# Patient Record
Sex: Female | Born: 1970 | Race: White | Hispanic: No | Marital: Married | Smoking: Never smoker
Health system: Southern US, Community
[De-identification: ages and names within clinical notes are randomized; demographics above are authoritative.]

## PROBLEM LIST (undated history)

## (undated) DIAGNOSIS — R011 Cardiac murmur, unspecified: Secondary | ICD-10-CM

## (undated) DIAGNOSIS — G4700 Insomnia, unspecified: Secondary | ICD-10-CM

## (undated) DIAGNOSIS — D649 Anemia, unspecified: Secondary | ICD-10-CM

## (undated) DIAGNOSIS — A64 Unspecified sexually transmitted disease: Secondary | ICD-10-CM

## (undated) DIAGNOSIS — E785 Hyperlipidemia, unspecified: Secondary | ICD-10-CM

## (undated) HISTORY — DX: Insomnia, unspecified: G47.00

## (undated) HISTORY — DX: Unspecified sexually transmitted disease: A64

## (undated) HISTORY — DX: Hyperlipidemia, unspecified: E78.5

## (undated) HISTORY — DX: Cardiac murmur, unspecified: R01.1

## (undated) HISTORY — DX: Anemia, unspecified: D64.9

---

## 1996-02-20 HISTORY — PX: DILATION AND EVACUATION: SHX1459

## 2004-01-17 ENCOUNTER — Other Ambulatory Visit: Admission: RE | Admit: 2004-01-17 | Discharge: 2004-01-17 | Payer: Self-pay | Admitting: Gynecology

## 2004-02-20 HISTORY — PX: UMBILICAL HERNIA REPAIR: SHX196

## 2004-05-15 ENCOUNTER — Ambulatory Visit (HOSPITAL_BASED_OUTPATIENT_CLINIC_OR_DEPARTMENT_OTHER): Admission: RE | Admit: 2004-05-15 | Discharge: 2004-05-15 | Payer: Self-pay

## 2004-05-15 ENCOUNTER — Ambulatory Visit (HOSPITAL_COMMUNITY): Admission: RE | Admit: 2004-05-15 | Discharge: 2004-05-15 | Payer: Self-pay

## 2005-04-02 ENCOUNTER — Other Ambulatory Visit: Admission: RE | Admit: 2005-04-02 | Discharge: 2005-04-02 | Payer: Self-pay | Admitting: Gynecology

## 2006-04-08 ENCOUNTER — Other Ambulatory Visit: Admission: RE | Admit: 2006-04-08 | Discharge: 2006-04-08 | Payer: Self-pay | Admitting: Gynecology

## 2007-07-08 ENCOUNTER — Other Ambulatory Visit: Admission: RE | Admit: 2007-07-08 | Discharge: 2007-07-08 | Payer: Self-pay | Admitting: Gynecology

## 2009-04-28 ENCOUNTER — Encounter: Admission: RE | Admit: 2009-04-28 | Discharge: 2009-04-28 | Payer: Self-pay | Admitting: Obstetrics & Gynecology

## 2009-05-10 ENCOUNTER — Encounter: Admission: RE | Admit: 2009-05-10 | Discharge: 2009-05-10 | Payer: Self-pay | Admitting: Obstetrics & Gynecology

## 2009-11-29 ENCOUNTER — Encounter: Admission: RE | Admit: 2009-11-29 | Discharge: 2009-11-29 | Payer: Self-pay | Admitting: Obstetrics & Gynecology

## 2010-06-06 ENCOUNTER — Other Ambulatory Visit: Payer: Self-pay | Admitting: Obstetrics & Gynecology

## 2010-06-06 DIAGNOSIS — R921 Mammographic calcification found on diagnostic imaging of breast: Secondary | ICD-10-CM

## 2010-06-09 ENCOUNTER — Ambulatory Visit
Admission: RE | Admit: 2010-06-09 | Discharge: 2010-06-09 | Disposition: A | Discharge status: Home / Self Care | Payer: Commercial Indemnity | Source: Ambulatory Visit | Attending: Obstetrics & Gynecology | Admitting: Obstetrics & Gynecology

## 2010-06-09 DIAGNOSIS — R921 Mammographic calcification found on diagnostic imaging of breast: Secondary | ICD-10-CM

## 2011-03-21 ENCOUNTER — Other Ambulatory Visit: Payer: Self-pay | Admitting: Certified Nurse Midwife

## 2011-03-21 DIAGNOSIS — N63 Unspecified lump in unspecified breast: Secondary | ICD-10-CM

## 2011-03-21 LAB — HM PAP SMEAR: HM Pap smear: NEGATIVE

## 2011-03-29 ENCOUNTER — Ambulatory Visit
Admission: RE | Admit: 2011-03-29 | Discharge: 2011-03-29 | Disposition: A | Discharge status: Home / Self Care | Payer: Commercial Indemnity | Source: Ambulatory Visit | Attending: Certified Nurse Midwife | Admitting: Certified Nurse Midwife

## 2011-03-29 ENCOUNTER — Other Ambulatory Visit: Payer: Self-pay | Admitting: Obstetrics & Gynecology

## 2011-03-29 DIAGNOSIS — N63 Unspecified lump in unspecified breast: Secondary | ICD-10-CM

## 2012-03-21 ENCOUNTER — Other Ambulatory Visit: Payer: Self-pay | Admitting: Obstetrics & Gynecology

## 2012-03-21 DIAGNOSIS — Z1231 Encounter for screening mammogram for malignant neoplasm of breast: Secondary | ICD-10-CM

## 2012-04-14 ENCOUNTER — Ambulatory Visit
Admission: RE | Admit: 2012-04-14 | Discharge: 2012-04-14 | Disposition: A | Discharge status: Home / Self Care | Payer: BC Managed Care – PPO | Source: Ambulatory Visit | Attending: Obstetrics & Gynecology | Admitting: Obstetrics & Gynecology

## 2012-04-14 DIAGNOSIS — Z1231 Encounter for screening mammogram for malignant neoplasm of breast: Secondary | ICD-10-CM

## 2012-04-14 LAB — HM MAMMOGRAPHY

## 2012-07-28 ENCOUNTER — Other Ambulatory Visit: Payer: Self-pay

## 2012-09-10 ENCOUNTER — Telehealth: Payer: Self-pay | Admitting: Certified Nurse Midwife

## 2012-09-10 NOTE — Telephone Encounter (Signed)
pt has not had a cycle in three months and is having hot flashes

## 2012-09-10 NOTE — Telephone Encounter (Signed)
Patient last AEX 04/04/2012 with Ortencia Kick  In paper chart. Patient calling today with stating no cycle in 3 month for sure but can not remember if had one in April / 2014. Hot flashes are getting more also.  Appointment given for Friday July 25th @ 1:30pm for OV with Dr. Edward Jolly.

## 2012-09-12 ENCOUNTER — Ambulatory Visit (INDEPENDENT_AMBULATORY_CARE_PROVIDER_SITE_OTHER): Payer: BC Managed Care – PPO | Admitting: Obstetrics and Gynecology

## 2012-09-12 ENCOUNTER — Encounter: Payer: Self-pay | Admitting: Obstetrics and Gynecology

## 2012-09-12 VITALS — BP 100/58 | HR 68 | Ht 64.0 in | Wt 118.0 lb

## 2012-09-12 DIAGNOSIS — N926 Irregular menstruation, unspecified: Secondary | ICD-10-CM

## 2012-09-12 DIAGNOSIS — N951 Menopausal and female climacteric states: Secondary | ICD-10-CM

## 2012-09-12 DIAGNOSIS — E785 Hyperlipidemia, unspecified: Secondary | ICD-10-CM

## 2012-09-12 LAB — POCT URINE PREGNANCY: Preg Test, Ur: NEGATIVE

## 2012-09-12 NOTE — Progress Notes (Signed)
Patient ID: Crystal Macdonald, female   DOB: Jun 14, 1970, 42 y.o.   MRN: [id number]  42 y.o.   Unknown    Caucasian   female   [id number]   here for menopausal symptoms. Menses not regular for a year or two - every 17 - 33 days. Now no menses since April.   Having hot flashes, then cold.   Night sweats.  No vaginal dryness. No emotional changes.  Condoms for birth control. Not very sexually active.   Hasn't thought too much about hot flashes and treatment of them.   Uncertain about mother and age of menopause.  Took OCPs as a senior in high school due to heavy bleeding and then for birth control for 6 -7 years in her marriage. Had some nausea with them.    Strong family history of early heart disease. Patient started exercising and taking Fish Oil capsules 6 weeks ago.  03/25/12 - T chol 267, LDL - 189, HDL 68, Total chol/HDl ratio 3.9, TG 48.  Mother had low bone mass and took Fosamax.     Patient's last menstrual period was 05/20/2012.          Sexually active: yes  The current method of family planning is condoms     Last mammogram:  04/14/12 - WNL    Family History  Problem Relation Age of Onset  . Diabetes Paternal Grandmother     adult onset  . Stroke Paternal Grandmother     deceased  . Hypertension Mother   . Heart attack Mother     age 46  . Hypertension Father   . Stroke Paternal Grandfather     deceased  . Heart attack Paternal Grandfather   . Heart attack Maternal Uncle     deceased age 66  . Heart attack Maternal Grandmother   . Heart attack Maternal Aunt     There are no active problems to display for this patient.   Past Medical History  Diagnosis Date  . 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 Hyperlipidemia     Past Surgical History  Procedure Laterality Date  . Cesarean section  1996  . Dilation and evacuation  1998  . Umbilical hernia repair  2006    with mesh--Dr. Orson Slick    Allergies: Review of patient's allergies indicates  no known allergies.  Current Outpatient Prescriptions  Medication Sig Dispense Refill  . fish oil-omega-3 fatty acids 1000 MG capsule Take 2 g by mouth daily.       No current facility-administered medications for this visit.    ROS: Pertinent items are noted in HPI.  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  Exam:    BP 100/58  Pulse 68  Ht 5\' 4"  (1.626 m)  Wt 118 lb (53.524 kg)  BMI 20.24 kg/m2  LMP 05/20/2012   Wt Readings from Last 3 Encounters:  09/12/12 118 lb (53.524 kg)     Ht Readings from Last 3 Encounters:  09/12/12 5\' 4"  (1.626 m)    General appearance: alert, cooperative and appears stated age   Pelvic: External genitalia:  no lesions              Urethra:  normal appearing urethra with no masses, tenderness or lesions              Bartholins and Skenes: normal                 Vagina:  Some  petechiae of the cervix and vaginal cuff with speculum exam              Cervix: normal appearance             Bimanual Exam:  Uterus:  uterus is normal size, shape, consistency and nontender                                      Adnexa: normal adnexa in size, nontender and no masses   Assessment  Menopausal symptoms. Hyperlipidemia.   Family history of premature cardiovascular disease.  Plan  Check FSH and estradiol. Declines Provera challenge. I discussed risks and benefits of ultralow dose OCPs vs. HRT vs. Herbal remedies such as Estroven.  Will await results for decision. We discussed osteoporosis prevention. Patient will have her fasting cholesterol panel rechecked in about two months.   An After Visit Summary was printed and given to the patient.

## 2012-09-12 NOTE — Patient Instructions (Signed)
Menopause Menopause is the normal time of life when menstrual periods stop completely. Menopause is complete when you have missed 12 consecutive menstrual periods. It usually occurs between the ages of 48 to 55, with an average age of 51. Very rarely does a woman develop menopause before 44 years old. At menopause, your ovaries stop producing the female hormones, estrogen and progesterone. This can cause undesirable symptoms and also affect your health. Sometimes the symptoms may occur 4 to 5 years before the menopause begins. There is no relationship between menopause and:  Oral contraceptives.  Number of children you had.  Race.  The age your menstrual periods started (menarche). Heavy smokers and very thin women may develop menopause earlier in life. CAUSES  The ovaries stop producing the female hormones estrogen and progesterone.  Other causes include:  Surgery to remove both ovaries.  The ovaries stop functioning for no known reason.  Tumors of the pituitary gland in the brain.  Medical disease that affects the ovaries and hormone production.  Radiation treatment to the abdomen or pelvis.  Chemotherapy that affects the ovaries. SYMPTOMS   Hot flashes.  Night sweats.  Decrease in sex drive.  Vaginal dryness and thinning of the vagina causing painful intercourse.  Dryness of the skin and developing wrinkles.  Headaches.  Tiredness.  Irritability.  Memory problems.  Weight gain.  Bladder infections.  Hair growth of the face and chest.  Infertility. More serious symptoms include:  Loss of bone (osteoporosis) causing breaks (fractures).  Depression.  Hardening and narrowing of the arteries (atherosclerosis) causing heart attacks and strokes. DIAGNOSIS   When the menstrual periods have stopped for 12 straight months.  Physical exam.  Hormone studies of the blood. TREATMENT  There are many treatment choices and nearly as many questions about them.  The decisions to treat or not to treat menopausal changes is an individual choice made with your caregiver. Your caregiver can discuss the treatments with you. Together, you can decide which treatment will work best for you. Your treatment choices may include:   Hormone therapy (estorgen and progesterone).  Non-hormonal medications.  Treating the individual symptoms with medication (for example antidepressants for depression).  Herbal medications that may help specific symptoms.  Counseling by a psychiatrist or psychologist.  Group therapy.  Lifestyle changes including:  Eating healthy.  Regular exercise.  Limiting caffeine and alcohol.  Stress management and meditation.  No treatment. HOME CARE INSTRUCTIONS   Take the medication your caregiver gives you as directed.  Get plenty of sleep and rest.  Exercise regularly.  Eat a diet that contains calcium (good for the bones) and soy products (acts like estrogen hormone).  Avoid alcoholic beverages.  Do not smoke.  If you have hot flashes, dress in layers.  Take supplements, calcium and vitamin D to strengthen bones.  You can use over-the-counter lubricants or moisturizers for vaginal dryness.  Group therapy is sometimes very helpful.  Acupuncture may be helpful in some cases. SEEK MEDICAL CARE IF:   You are not sure you are in menopause.  You are having menopausal symptoms and need advice and treatment.  You are still having menstrual periods after age 55.  You have pain with intercourse.  Menopause is complete (no menstrual period for 12 months) and you develop vaginal bleeding.  You need a referral to a specialist (gynecologist, psychiatrist or psychologist) for treatment. SEEK IMMEDIATE MEDICAL CARE IF:   You have severe depression.  You have excessive vaginal bleeding.  You fell and   think you have a broken bone.  You have pain when you urinate.  You develop leg or chest pain.  You have a fast  pounding heart beat (palpitations).  You have severe headaches.  You develop vision problems.  You feel a lump in your breast.  You have abdominal pain or severe indigestion. Document Released: 04/28/2003 Document Revised: 04/30/2011 Document Reviewed: 12/04/2007 [facility] Patient Information 2014 [place], [place].  Menopause and Herbal Products Menopause is the normal time of life when menstrual periods stop completely. Menopause is complete when you have missed 12 consecutive menstrual periods. It usually occurs between the ages of 80 to 66, with an average age of 13. Very rarely does a woman develop menopause before 44 years old. At menopause, your ovaries stop producing the female hormones, estrogen and progesterone. This can cause undesirable symptoms and also affect your health. Sometimes the symptoms can occur 4 to 5 years before the menopause begins. There is no relationship between menopause and:  Oral contraceptives.  Number of children you had.  Race.  The age your menstrual periods started (menarche). Heavy smokers and very thin women may develop menopause earlier in life. Estrogen and progesterone hormone treatment is the usual method of treating menopausal symptoms. However, there are women who should not take hormone treatment. This is true of:   Women that have breast or uterine cancer.  Women who prefer not to take hormones because of certain side effects (abnormal uterine bleeding).  Women who are afraid that hormones may cause breast cancer.  Women who have a history of liver disease, heart disease, stroke, or blood clots. For these women, there are other medications that may help treat their menopausal symptoms. These medications are found in plants and botanical products. They can be found in the form of herbs, teas, oils, tinctures, and pills.  CAUSES:  The ovaries stop producing the female hormones estrogen and progesterone.  Other causes include:  Surgery  to remove both ovaries.  The ovaries stop functioning for no know reason.  Tumors of the pituitary gland in the brain.  Medical disease that affects the ovaries and hormone production.  Radiation treatment to the abdomen or pelvis.  Chemotherapy that affects the ovaries. PHYTOESTROGENS: Phytoestrogens occur naturally in plants and plant products. They act like estrogen in the body. Herbal medications are made from these plants and botanical steroids. There are 3 types of phytoestrogens:  Isoflavones (genistein and daidzein) are found in soy, garbanzo beans, miso and tofu foods.  Ligins are found in the shell of seeds. They are used to make oils like flaxseed oil. The bacteria in your intestine act on these foods to produce the estrogen-like hormones.  Coumestans are estrogen-like. Some of the foods they are found in include sunflower seeds and bean sprouts. CONDITIONS AND THEIR POSSIBLE HERBAL TREATMENT:  Hot flashes and night sweats.  Soy, black cohosh and evening primrose.  Irritability, insomnia, depression and memory problems.  Chasteberry, ginseng, and soy.  St. John's wort may be helpful for depression. However, there is a concern of it causing cataracts of the eye and may have bad effects on other medications. St. John's wort should not be taken for long time and without your caregiver's advice.  Loss of libido and vaginal and skin dryness.  Wild yam and soy.  Prevention of coronary heart disease and osteoporosis.  Soy and Isoflavones. Several studies have shown that some women benefit from herbal medications, but most of the studies have not consistently shown that these  supplements are much better than placebo. Other forms of treatment to help women with menopausal symptoms include a balanced diet, rest, exercise, vitamin and calcium (with vitamin D) supplements, acupuncture, and group therapy when necessary. THOSE WHO SHOULD NOT TAKE HERBAL MEDICATIONS  INCLUDE:  Women who are planning on getting pregnant unless told by your caregiver.  Women who are breastfeeding unless told by your caregiver.  Women who are taking other prescription medications unless told by your caregiver.  Infants, children, and elderly women unless told by your caregiver. Different herbal medications have different and unmeasured amounts of the herbal ingredients. There are no regulations, quality control, and standardization of the ingredients in herbal medications. Therefore, the amount of the ingredient in the medication may vary from one herb, pill, tea, oil or tincture to another. Many herbal medications can cause serious problems and can even have poisonous effects if taken too much or too long. If problems develop, the medication should be stopped and recorded by your caregiver. HOME CARE INSTRUCTIONS  Do not take or give children herbal medications without your caregiver's advice.  Let your caregiver know all the medications you are taking. This includes prescription, over-the-counter, eye drops, and creams.  Do not take herbal medications longer or more than recommended.  Tell your caregiver about any side effects from the medication. SEEK MEDICAL CARE IF:  You develop a fever of 102 F (38.9 C), or as directed by your caregiver.  You feel sick to your stomach (nauseous), vomit, or have diarrhea.  You develop a rash.  You develop abdominal pain.  You develop severe headaches.  You start to have vision problems.  You feel dizzy or faint.  You start to feel numbness in any part of your body.  You start shaking (have convulsions). Document Released: 07/25/2007 Document Revised: 01/23/2012 Document Reviewed: 02/21/2010 [facility] Patient Information 2014 [place], [place].

## 2012-09-13 LAB — FOLLICLE STIMULATING HORMONE: FSH: 195 m[IU]/mL — ABNORMAL HIGH

## 2012-09-13 LAB — ESTRADIOL: Estradiol: 14.3 pg/mL

## 2012-12-25 ENCOUNTER — Other Ambulatory Visit: Payer: Self-pay

## 2013-03-27 ENCOUNTER — Encounter: Payer: Self-pay | Admitting: Certified Nurse Midwife

## 2013-03-27 ENCOUNTER — Ambulatory Visit (INDEPENDENT_AMBULATORY_CARE_PROVIDER_SITE_OTHER): Payer: BC Managed Care – PPO | Admitting: Certified Nurse Midwife

## 2013-03-27 VITALS — BP 100/70 | HR 68 | Resp 16 | Ht 63.75 in | Wt 117.0 lb

## 2013-03-27 DIAGNOSIS — N912 Amenorrhea, unspecified: Secondary | ICD-10-CM

## 2013-03-27 DIAGNOSIS — Z01419 Encounter for gynecological examination (general) (routine) without abnormal findings: Secondary | ICD-10-CM

## 2013-03-27 DIAGNOSIS — Z0000 Encounter for general adult medical examination without abnormal findings: Secondary | ICD-10-CM

## 2013-03-27 LAB — LIPID PANEL
Cholesterol: 302 mg/dL — ABNORMAL HIGH (ref 0–200)
HDL: 80 mg/dL (ref >39)
LDL CALC: 209 mg/dL — AB (ref 0–99)
Total CHOL/HDL Ratio: 3.8 Ratio
Triglycerides: 66 mg/dL (ref <150)
VLDL: 13 mg/dL (ref 0–40)

## 2013-03-27 LAB — POCT URINALYSIS DIPSTICK
Bilirubin, UA: NEGATIVE
Blood, UA: NEGATIVE
Glucose, UA: NEGATIVE
Ketones, UA: NEGATIVE
Leukocytes, UA: NEGATIVE
Nitrite, UA: NEGATIVE
Protein, UA: NEGATIVE
Urobilinogen, UA: NEGATIVE
pH, UA: 5

## 2013-03-27 LAB — HEMOGLOBIN, FINGERSTICK: Hemoglobin, fingerstick: 13 g/dL (ref 12.0–16.0)

## 2013-03-27 LAB — TSH: TSH: 1.679 u[IU]/mL (ref 0.350–4.500)

## 2013-03-27 LAB — POCT URINE PREGNANCY: Preg Test, Ur: NEGATIVE

## 2013-03-27 NOTE — Progress Notes (Signed)
43 y.o. [id number] Married Caucasian Fe here for annual exam. Patient has not had a period since 05/20/12. Denies vaginal spotting or bleeding. Continues with some night sweats and irritability. Denies vaginal dryness issues. Sees PCP for prn issues only. Patient had Solana in 7/14 which indicated menopausal range. Patient complaining of some vulva itching off and on for the past week. No new personal products. No other health issues.   Patient's last menstrual period was 05/20/2012.          Sexually active: yes  The current method of family planning is condoms all the time.    Exercising: no  exercise Smoker:  no  Health Maintenance: Pap:  03-21-11 neg HPV HR neg MMG: 2/24/ 2014 neg Colonoscopy:  none BMD:   none TDaP:  unsure Labs: Poct urine-neg,Hgb-13.0, Upt-neg Self breast exam: done occ   reports that she has never smoked. She does not have any smokeless tobacco history on file. She reports that she does not drink alcohol or use illicit drugs.  Past Medical History  Diagnosis Date  . 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 Hyperlipidemia     Past Surgical History  Procedure Laterality Date  . Cesarean section  1996  . Dilation and evacuation  1998  . Umbilical hernia repair  2006    with mesh--Dr. Deon Pilling    Current Outpatient Prescriptions  Medication Sig Dispense Refill  . fish oil-omega-3 fatty acids 1000 MG capsule Take 2 g by mouth as needed.       . Multiple Vitamins-Minerals (HAIR/SKIN/NAILS PO) Take by mouth as needed.       No current facility-administered medications for this visit.    Family History  Problem Relation Age of Onset  . Diabetes Paternal Grandmother     adult onset  . Stroke Paternal Grandmother     deceased  . Hypertension Mother   . Hypertension Father   . Stroke Paternal Grandfather     deceased  . Heart attack Maternal Uncle     deceased age 75  . Heart attack Maternal Grandmother   . Heart attack Maternal Aunt   . Heart  attack Maternal Grandfather   . Heart attack Maternal Uncle   . Heart attack Maternal Aunt     ROS:  Pertinent items are noted in HPI.  Otherwise, a comprehensive ROS was negative.  Exam:   BP 100/70  Pulse 68  Resp 16  Ht 5' 3.75" (1.619 m)  Wt 117 lb (53.071 kg)  BMI 20.25 kg/m2  LMP 05/20/2012 Height: 5' 3.75" (161.9 cm)  Ht Readings from Last 3 Encounters:  03/27/13 5' 3.75" (1.619 m)  09/12/12 5\' 4"  (1.626 m)    General appearance: alert, cooperative and appears stated age Head: Normocephalic, without obvious abnormality, atraumatic Neck: no adenopathy, supple, symmetrical, trachea midline and thyroid normal to inspection and palpation Lungs: clear to auscultation bilaterally Breasts: normal appearance, no masses or tenderness, No nipple retraction or dimpling, No nipple discharge or bleeding, No axillary or supraclavicular adenopathy Heart: regular rate and rhythm Abdomen: soft, non-tender; no masses,  no organomegaly Extremities: extremities normal, atraumatic, no cyanosis or edema Skin: Skin color, texture, turgor normal. No rashes or lesions Lymph nodes: Cervical, supraclavicular, and axillary nodes normal. No abnormal inguinal nodes palpated Neurologic: Grossly normal   Pelvic: External genitalia:  no lesions, no redness or edema or scaling wet prep taken  Urethra:  normal appearing urethra with no masses, tenderness or lesions              Bartholin's and Skene's: normal                 Vagina: normal appearing vagina with normal color and discharge, no lesions, moist ph 4.0 wet prep taken              Cervix: non tender,normal appearance              Pap taken: yes Bimanual Exam:  Uterus:  enlarged, 8 weeks size and retroverted, non tender  History of fibroids, no change in size from previous exam              Adnexa: normal adnexa and no mass, fullness, tenderness               Rectovaginal: Confirms               Anus:  normal sphincter tone, no  lesions  Wet prep negative A:  Well Woman with normal exam   Amenorrhea negative UPT, menopausal symptoms with FSH consistent with menopause  Contraception condoms  Elevated cholesterol diet controlled  Vulva itching, negative wet prep  P:   Reviewed health and wellness pertinent to exam  Discussed menopausal when no period for a year and FSH indicated menopausal range. Discussed questions regarding Provera use, which was mentioned at previous appointment. Also discussed HRT/OCP use for early menopause support and relief of symptoms. Questions addressed. Patient may consider use. Given handout on use of HRT and menopause. Instructed to notify if any vaginal bleeding.   Continue work on exercise and healthy diet  Reviewed findings, suggested change to cream base soap to see if resolves, ? Just dry skin  Labs:TSH,Lipid panel, Vitamin D,FSH  Pap smear as per guidelines   Mammogram yearly pap smear taken today with reflex  counseled on breast self exam, mammography screening, use and side effects of HRT, menopause, adequate intake of calcium and vitamin D, diet and exercise  return annually or prn  An After Visit Summary was printed and given to the patient.

## 2013-03-27 NOTE — Patient Instructions (Addendum)
EXERCISE AND DIET:  We recommended that you start or continue a regular exercise program for good health. Regular exercise means any activity that makes your heart beat faster and makes you sweat.  We recommend exercising at least 30 minutes per day at least 3 days a week, preferably 4 or 5.  We also recommend a diet low in fat and sugar.  Inactivity, poor dietary choices and obesity can cause diabetes, heart attack, stroke, and kidney damage, among others.    ALCOHOL AND SMOKING:  Women should limit their alcohol intake to no more than 7 drinks/beers/glasses of wine (combined, not each!) per week. Moderation of alcohol intake to this level decreases your risk of breast cancer and liver damage. And of course, no recreational drugs are part of a healthy lifestyle.  And absolutely no smoking or even second hand smoke. Most people know smoking can cause heart and lung diseases, but did you know it also contributes to weakening of your bones? Aging of your skin?  Yellowing of your teeth and nails?  CALCIUM AND VITAMIN D:  Adequate intake of calcium and Vitamin D are recommended.  The recommendations for exact amounts of these supplements seem to change often, but generally speaking 600 mg of calcium (either carbonate or citrate) and 800 units of Vitamin D per day seems prudent. Certain women may benefit from higher intake of Vitamin D.  If you are among these women, your doctor will have told you during your visit.    PAP SMEARS:  Pap smears, to check for cervical cancer or precancers,  have traditionally been done yearly, although recent scientific advances have shown that most women can have pap smears less often.  However, every woman still should have a physical exam from her gynecologist every year. It will include a breast check, inspection of the vulva and vagina to check for abnormal growths or skin changes, a visual exam of the cervix, and then an exam to evaluate the size and shape of the uterus and  ovaries.  And after 45 years of age, a rectal exam is indicated to check for rectal cancers. We will also provide age appropriate advice regarding health maintenance, like when you should have certain vaccines, screening for sexually transmitted diseases, bone density testing, colonoscopy, mammograms, etc.   MAMMOGRAMS:  All women over 40 years old should have a yearly mammogram. Many facilities now offer a "3D" mammogram, which may cost around $50 extra out of pocket. If possible,  we recommend you accept the option to have the 3D mammogram performed.  It both reduces the number of women who will be called back for extra views which then turn out to be normal, and it is better than the routine mammogram at detecting truly abnormal areas.    COLONOSCOPY:  Colonoscopy to screen for colon cancer is recommended for all women at age 50.  We know, you hate the idea of the prep.  We agree, BUT, having colon cancer and not knowing it is worse!!  Colon cancer so often starts as a polyp that can be seen and removed at colonscopy, which can quite literally save your life!  And if your first colonoscopy is normal and you have no family history of colon cancer, most women don't have to have it again for 10 years.  Once every ten years, you can do something that may end up saving your life, right?  We will be happy to help you get it scheduled when you are ready.    Be sure to check your insurance coverage so you understand how much it will cost.  It may be covered as a preventative service at no cost, but you should check your particular policy.     Menopause Menopause is the normal time of life when menstrual periods stop completely. Menopause is complete when you have missed 12 consecutive menstrual periods. It usually occurs between the ages of 94 years and 77 years. Very rarely does a woman develop menopause before the age of 22 years. At menopause, your ovaries stop producing the female hormones estrogen and  progesterone. This can cause undesirable symptoms and also affect your health. Sometimes the symptoms may occur 4 5 years before the menopause begins. There is no relationship between menopause and:  Oral contraceptives.  Number of children you had.  Race.  The age your menstrual periods started (menarche). Heavy smokers and very thin women may develop menopause earlier in life. CAUSES  The ovaries stop producing the female hormones estrogen and progesterone.  Other causes include:  Surgery to remove both ovaries.  The ovaries stop functioning for no known reason.  Tumors of the pituitary gland in the brain.  Medical disease that affects the ovaries and hormone production.  Radiation treatment to the abdomen or pelvis.  Chemotherapy that affects the ovaries. SYMPTOMS   Hot flashes.  Night sweats.  Decrease in sex drive.  Vaginal dryness and thinning of the vagina causing painful intercourse.  Dryness of the skin and developing wrinkles.  Headaches.  Tiredness.  Irritability.  Memory problems.  Weight gain.  Bladder infections.  Hair growth of the face and chest.  Infertility. More serious symptoms include:  Loss of bone (osteoporosis) causing breaks (fractures).  Depression.  Hardening and narrowing of the arteries (atherosclerosis) causing heart attacks and strokes. DIAGNOSIS   When the menstrual periods have stopped for 12 straight months.  Physical exam.  Hormone studies of the blood. TREATMENT  There are many treatment choices and nearly as many questions about them. The decisions to treat or not to treat menopausal changes is an individual choice made with your health care provider. Your health care provider can discuss the treatments with you. Together, you can decide which treatment will work best for you. Your treatment choices may include:   Hormone therapy (estrogen and progesterone).  Non-hormonal medicines.  Treating the  individual symptoms with medicine (for example antidepressants for depression).  Herbal medicines that may help specific symptoms.  Counseling by a psychiatrist or psychologist.  Group therapy.  Lifestyle changes including:  Eating healthy.  Regular exercise.  Limiting caffeine and alcohol.  Stress management and meditation.  No treatment. HOME CARE INSTRUCTIONS   Take the medicine your health care provider gives you as directed.  Get plenty of sleep and rest.  Exercise regularly.  Eat a diet that contains calcium (good for the bones) and soy products (acts like estrogen hormone).  Avoid alcoholic beverages.  Do not smoke.  If you have hot flashes, dress in layers.  Take supplements, calcium, and vitamin D to strengthen bones.  You can use over-the-counter lubricants or moisturizers for vaginal dryness.  Group therapy is sometimes very helpful.  Acupuncture may be helpful in some cases. SEEK MEDICAL CARE IF:   You are not sure you are in menopause.  You are having menopausal symptoms and need advice and treatment.  You are still having menstrual periods after age 73 years.  You have pain with intercourse.  Menopause is complete (no menstrual period  for 12 months) and you develop vaginal bleeding.  You need a referral to a specialist (gynecologist, psychiatrist, or psychologist) for treatment. SEEK IMMEDIATE MEDICAL CARE IF:   You have severe depression.  You have excessive vaginal bleeding.  You fell and think you have a broken bone.  You have pain when you urinate.  You develop leg or chest pain.  You have a fast pounding heart beat (palpitations).  You have severe headaches.  You develop vision problems.  You feel a lump in your breast.  You have abdominal pain or severe indigestion. Document Released: 04/28/2003 Document Revised: 10/08/2012 Document Reviewed: 09/04/2012 [facility] Patient Information 2014 [place], [place].

## 2013-03-28 LAB — FOLLICLE STIMULATING HORMONE: FSH: 168.5 m[IU]/mL — ABNORMAL HIGH

## 2013-03-28 LAB — VITAMIN D 25 HYDROXY (VIT D DEFICIENCY, FRACTURES): Vit D, 25-Hydroxy: 29 ng/mL — ABNORMAL LOW (ref 30–89)

## 2013-03-31 LAB — IPS PAP TEST WITH REFLEX TO HPV

## 2013-04-03 ENCOUNTER — Other Ambulatory Visit: Payer: Self-pay | Admitting: Certified Nurse Midwife

## 2013-04-03 DIAGNOSIS — R6889 Other general symptoms and signs: Secondary | ICD-10-CM

## 2013-04-03 NOTE — Progress Notes (Signed)
Reviewed personally.  M. Suzanne Tuyet Bader, MD.  

## 2013-04-17 ENCOUNTER — Other Ambulatory Visit: Payer: Self-pay

## 2013-04-17 DIAGNOSIS — Z1231 Encounter for screening mammogram for malignant neoplasm of breast: Secondary | ICD-10-CM

## 2013-04-20 ENCOUNTER — Ambulatory Visit
Admission: RE | Admit: 2013-04-20 | Discharge: 2013-04-20 | Disposition: A | Discharge status: Home / Self Care | Payer: BC Managed Care – PPO | Source: Ambulatory Visit

## 2013-04-20 DIAGNOSIS — Z1231 Encounter for screening mammogram for malignant neoplasm of breast: Secondary | ICD-10-CM

## 2013-05-01 ENCOUNTER — Other Ambulatory Visit (INDEPENDENT_AMBULATORY_CARE_PROVIDER_SITE_OTHER): Payer: BC Managed Care – PPO

## 2013-05-01 DIAGNOSIS — R6889 Other general symptoms and signs: Secondary | ICD-10-CM

## 2013-05-01 LAB — LIPID PANEL
Cholesterol: 261 mg/dL — ABNORMAL HIGH (ref 0–200)
HDL: 75 mg/dL (ref >39)
LDL Cholesterol: 176 mg/dL — ABNORMAL HIGH (ref 0–99)
Total CHOL/HDL Ratio: 3.5 Ratio
Triglycerides: 48 mg/dL (ref <150)
VLDL: 10 mg/dL (ref 0–40)

## 2013-05-05 ENCOUNTER — Telehealth: Payer: Self-pay | Admitting: Emergency Medicine

## 2013-05-05 NOTE — Telephone Encounter (Signed)
Lab Results  Component Value Date   CHOL 261* 05/01/2013   HDL 75 05/01/2013   LDLCALC 176* 05/01/2013   TRIG 48 05/01/2013   CHOLHDL 3.5 05/01/2013   Pt notified. Pt sees Dr. Moreen Fowler but is looking for a femal provider. Patient is coming to pick up copies of lab results to take with her to PCP. Patient is aware consent to be signed when picking up.

## 2013-05-05 NOTE — Telephone Encounter (Signed)
Message copied by Michele Mcalpine on Tue May 05, 2013 12:23 PM ------      Message from: Regina Eck      Created: Sun May 03, 2013  1:53 PM       Notify patient cholesterol is better but still high, feel she needs PCP following this. Does she have PCP she can schedule with or do we need to refer? If she does copy of labs will need to be sent. ------

## 2013-07-14 ENCOUNTER — Ambulatory Visit (INDEPENDENT_AMBULATORY_CARE_PROVIDER_SITE_OTHER): Payer: BC Managed Care – PPO | Admitting: Nurse Practitioner

## 2013-07-14 ENCOUNTER — Encounter: Payer: Self-pay | Admitting: Nurse Practitioner

## 2013-07-14 VITALS — BP 100/64 | HR 56 | Resp 16 | Ht 63.75 in | Wt 119.4 lb

## 2013-07-14 DIAGNOSIS — N760 Acute vaginitis: Secondary | ICD-10-CM

## 2013-07-14 DIAGNOSIS — B9689 Other specified bacterial agents as the cause of diseases classified elsewhere: Secondary | ICD-10-CM

## 2013-07-14 DIAGNOSIS — A499 Bacterial infection, unspecified: Secondary | ICD-10-CM

## 2013-07-14 MED ORDER — METRONIDAZOLE 0.75 % VA GEL: 1.0000 | Freq: Every day | VAGINAL | Status: DC

## 2013-07-14 MED ORDER — FLUCONAZOLE 150 MG PO TABS: 150.0000 mg | ORAL_TABLET | Freq: Once | ORAL | Status: DC

## 2013-07-14 NOTE — Progress Notes (Signed)
43 y.o.Married Caucasian female [phone] with a 2 week(s) history of the following:dyspareunia, local irritation and vulvar itching Sexually active: no Last sexual activity:some time ago. Pt also reports the following associated symptoms: urinary urgency that is not new.  Patient has not tried over the counter treatment.  No other abdominal or pelvic pain.     Exam:  XYI:AXKPVV, Bartholin's, Urethra, Skene's normal                ZSM:OLMBEMLJQ: yellow and thin, pH 5.5, wet prep done                Cx:  normal appearance                Uterus:anteverted                Adnexa: not evaluated  Wet Prep shows:clue cells multiple with (1) yeast, PH 5.5   Dx:   Bacterial vaginosis  Atrophic vaginitis   Tx   :Scientist, clinical (histocompatibility and immunogenetics) distributed.  Treatment with Metrogel vaginal cream HS X 7  Will follow with Diflucan 150 mg if needed  Discussion about WHI study and +/- benefits and risk of HRT - she will follow with Ms. Debbie and decide later.

## 2013-07-17 NOTE — Progress Notes (Signed)
Encounter reviewed by Dr. Brook Silva.  

## 2013-11-25 ENCOUNTER — Encounter: Payer: Self-pay | Admitting: Nurse Practitioner

## 2013-11-25 ENCOUNTER — Ambulatory Visit (INDEPENDENT_AMBULATORY_CARE_PROVIDER_SITE_OTHER): Payer: BC Managed Care – PPO | Admitting: Nurse Practitioner

## 2013-11-25 VITALS — BP 110/76 | HR 80 | Ht 63.75 in | Wt 115.0 lb

## 2013-11-25 DIAGNOSIS — A499 Bacterial infection, unspecified: Secondary | ICD-10-CM

## 2013-11-25 DIAGNOSIS — B9689 Other specified bacterial agents as the cause of diseases classified elsewhere: Secondary | ICD-10-CM

## 2013-11-25 DIAGNOSIS — E288 Other ovarian dysfunction: Secondary | ICD-10-CM

## 2013-11-25 DIAGNOSIS — N760 Acute vaginitis: Secondary | ICD-10-CM

## 2013-11-25 DIAGNOSIS — E2839 Other primary ovarian failure: Secondary | ICD-10-CM

## 2013-11-25 DIAGNOSIS — N952 Postmenopausal atrophic vaginitis: Secondary | ICD-10-CM

## 2013-11-25 MED ORDER — METRONIDAZOLE 0.75 % VA GEL: 1.0000 | Freq: Every day | VAGINAL | Status: DC

## 2013-11-25 NOTE — Progress Notes (Signed)
Subjective:     Patient ID: Crystal Macdonald, female   DOB: [birth year], 43 y.o.   MRN: [id number]  HPI This 43 yo WM Fe G3P2012 presents with symptoms of BKV for this past week.  External  irritation and burning and a moist feeling.  No bladder.   No changes in personal products, soaps. Not SA  LMP  05/2012.  She does have a lot of vaginal dryness and not SA secondary to dyspareunia.   Review of Systems  Constitutional: Negative for fever, chills and fatigue.  Gastrointestinal: Negative.   Genitourinary: Positive for vaginal discharge, vaginal pain and dyspareunia. Negative for dysuria, urgency, hematuria, flank pain and pelvic pain.  Musculoskeletal: Negative.   Neurological: Negative.   Psychiatric/Behavioral: Negative.        Objective:   Physical Exam  Constitutional: She is oriented to person, place, and time. She appears well-developed and well-nourished. No distress.  Abdominal: Soft. She exhibits no distension. There is no tenderness. There is no rebound and no guarding.  Genitourinary:  Atrophic changes, thin yellow vaginal discharge. No urethral pain.  Wet Prep: PH: 5.5; NSS: mu;tiple clue cells and atrophic cells; KOH: neg.  Neurological: She is oriented to person, place, and time.  Psychiatric: She has a normal mood and affect. Her behavior is normal. Judgment and thought content normal.       Assessment:     BV Atrophic vaginitis POF age 22 - not on HRT (no contraindications for HRT)    Plan:     Metrogel vaginal cream HS X 5 Discussion at length about HRT and vaginal estrogen She will consider and is asked to make appointment with Ms. Hollice Espy to further discuss and initiate treatment if she decides.

## 2013-11-25 NOTE — Patient Instructions (Signed)

## 2013-11-27 ENCOUNTER — Encounter: Payer: Self-pay | Admitting: Certified Nurse Midwife

## 2013-11-27 ENCOUNTER — Ambulatory Visit (INDEPENDENT_AMBULATORY_CARE_PROVIDER_SITE_OTHER): Payer: BC Managed Care – PPO | Admitting: Certified Nurse Midwife

## 2013-11-27 VITALS — BP 90/62 | HR 64 | Resp 16 | Ht 63.75 in | Wt 117.0 lb

## 2013-11-27 DIAGNOSIS — N951 Menopausal and female climacteric states: Secondary | ICD-10-CM

## 2013-11-27 NOTE — Progress Notes (Signed)
43 y.o.MarriedCaucasianfemale presents with symptoms of dry skin, some hot flashes and memory change and just feel older than my age. LMP one year ago with confirmed Kerr of menopause.  .  The symptoms have mildly affected her activities of daily living.  She reports sleeping well and no insomina. Patient was reading benefits of HRT on skin , memory, bone and vaginal health and would like to consider usage. Mother has Osteoporosis. Patient healthy, but has strong family history of cardiovascular disease with heart attack, father and mother have hypertension and father has had an abdominal aneurysm. Patient reports history of increasing rapid heart and irregular heart beat at times. Evaluated 10 years ago and felt no medication needed.   ROS: Pertinent items are noted in HPI. O: Healthy WDWN female Orientation  X 3  Assessment: Mild symptoms of menopause, but with would like to consider for management. Tachycardia episodes with irregular heart beat history.   Plan:  Discussed with patient the pros and cons of  Hormone replacement to relieve symptoms.  Discussed the finding of the WHI and Nurses study.  Discussed ACOG's statement regarding the use of hormones.Discussed risks and benefits and concerns with cardiovascular changes. Discussed would not put patient on HRT unless a negative Cardiology evaluation due to symptoms and family history. Patient agrees that she would not initiate if unsafe. Patient has cardiology resource and will schedule appointment for evaluation again. Will call if she needs our assistance in scheduling. Warnings of heart issues given and need to seek ER or 911.   35 minutes spent with patient in face to face consult regarding HRT use.

## 2013-11-27 NOTE — Progress Notes (Signed)
Reviewed personally.  M. Suzanne Lipa Knauff, MD.  

## 2013-11-29 NOTE — Progress Notes (Signed)
Encounter reviewed by Dr. Zenovia Justman Silva.  

## 2013-11-30 ENCOUNTER — Telehealth: Payer: Self-pay | Admitting: Certified Nurse Midwife

## 2013-11-30 MED ORDER — FLUCONAZOLE 150 MG PO TABS: 150.0000 mg | ORAL_TABLET | Freq: Once | ORAL | Status: DC

## 2013-11-30 NOTE — Telephone Encounter (Signed)
Most likely is yeast from the Metrogel treatment.  Ok to give her Diflucan 150 mg X 2 doses,  If symptoms persist to call back.

## 2013-11-30 NOTE — Telephone Encounter (Signed)
Spoke with patient. Was seen on 10/7 for BV.Patient states that she started metrogel on Wednesday and could see improvement. On Saturday patient began to have vaginal discharge that is thick and white in color with slight vaginal itching. "I think I have a yeast infection." Advised would send a message over to [place] Cage, [place] and return call with further recommendations. Patient is agreeable.

## 2013-11-30 NOTE — Telephone Encounter (Signed)
Spoke with patient. Advised of message as seen below from Milford Cage, [place]. Patient is agreeable and verbalizes understanding. rx for diflucan 150mg  x2 0RF sent to pharmacy on file.  Routing to provider for final review. Patient agreeable to disposition. Will close encounter

## 2013-11-30 NOTE — Telephone Encounter (Signed)
Pt said she was seen last wed and had a bacterial infection. Pt now thinks she has a yeast inf and wants to know if we can give her something for it. Pt says she is unable to com in for appt.

## 2013-12-21 ENCOUNTER — Encounter: Payer: Self-pay | Admitting: Certified Nurse Midwife

## 2014-03-30 ENCOUNTER — Encounter: Payer: Self-pay | Admitting: Certified Nurse Midwife

## 2014-03-30 ENCOUNTER — Ambulatory Visit (INDEPENDENT_AMBULATORY_CARE_PROVIDER_SITE_OTHER): Payer: BLUE CROSS/BLUE SHIELD | Admitting: Certified Nurse Midwife

## 2014-03-30 ENCOUNTER — Other Ambulatory Visit: Payer: Self-pay

## 2014-03-30 VITALS — BP 110/72 | HR 70 | Resp 16 | Ht 63.5 in | Wt 119.0 lb

## 2014-03-30 DIAGNOSIS — Z124 Encounter for screening for malignant neoplasm of cervix: Secondary | ICD-10-CM

## 2014-03-30 DIAGNOSIS — Z0000 Encounter for general adult medical examination without abnormal findings: Secondary | ICD-10-CM

## 2014-03-30 DIAGNOSIS — N951 Menopausal and female climacteric states: Secondary | ICD-10-CM

## 2014-03-30 DIAGNOSIS — Z01419 Encounter for gynecological examination (general) (routine) without abnormal findings: Secondary | ICD-10-CM

## 2014-03-30 DIAGNOSIS — Z1231 Encounter for screening mammogram for malignant neoplasm of breast: Secondary | ICD-10-CM

## 2014-03-30 LAB — COMPREHENSIVE METABOLIC PANEL
ALT: 16 U/L (ref 0–35)
AST: 18 U/L (ref 0–37)
Albumin: 4.8 g/dL (ref 3.5–5.2)
Alkaline Phosphatase: 65 U/L (ref 39–117)
BILIRUBIN TOTAL: 0.5 mg/dL (ref 0.2–1.2)
BUN: 11 mg/dL (ref 6–23)
CHLORIDE: 104 meq/L (ref 96–112)
CO2: 28 mEq/L (ref 19–32)
Calcium: 9.6 mg/dL (ref 8.4–10.5)
Creat: 0.74 mg/dL (ref 0.50–1.10)
GLUCOSE: 82 mg/dL (ref 70–99)
POTASSIUM: 4.2 meq/L (ref 3.5–5.3)
SODIUM: 136 meq/L (ref 135–145)
TOTAL PROTEIN: 7.1 g/dL (ref 6.0–8.3)

## 2014-03-30 LAB — CBC
HCT: 38.6 % (ref 36.0–46.0)
Hemoglobin: 12.9 g/dL (ref 12.0–15.0)
MCH: 29.3 pg (ref 26.0–34.0)
MCHC: 33.4 g/dL (ref 30.0–36.0)
MCV: 87.5 fL (ref 78.0–100.0)
MPV: 9 fL (ref 8.6–12.4)
Platelets: 227 10*3/uL (ref 150–400)
RBC: 4.41 MIL/uL (ref 3.87–5.11)
RDW: 13.5 % (ref 11.5–15.5)
WBC: 4.7 10*3/uL (ref 4.0–10.5)

## 2014-03-30 LAB — LIPID PANEL
Cholesterol: 256 mg/dL — ABNORMAL HIGH (ref 0–200)
HDL: 82 mg/dL (ref >39)
LDL Cholesterol: 163 mg/dL — ABNORMAL HIGH (ref 0–99)
Total CHOL/HDL Ratio: 3.1 Ratio
Triglycerides: 54 mg/dL (ref <150)
VLDL: 11 mg/dL (ref 0–40)

## 2014-03-30 LAB — HEMOGLOBIN, FINGERSTICK: Hemoglobin, fingerstick: 12.5 g/dL (ref 12.0–16.0)

## 2014-03-30 NOTE — Patient Instructions (Signed)
EXERCISE AND DIET:  We recommended that you start or continue a regular exercise program for good health. Regular exercise means any activity that makes your heart beat faster and makes you sweat.  We recommend exercising at least 30 minutes per day at least 3 days a week, preferably 4 or 5.  We also recommend a diet low in fat and sugar.  Inactivity, poor dietary choices and obesity can cause diabetes, heart attack, stroke, and kidney damage, among others.    ALCOHOL AND SMOKING:  Women should limit their alcohol intake to no more than 7 drinks/beers/glasses of wine (combined, not each!) per week. Moderation of alcohol intake to this level decreases your risk of breast cancer and liver damage. And of course, no recreational drugs are part of a healthy lifestyle.  And absolutely no smoking or even second hand smoke. Most people know smoking can cause heart and lung diseases, but did you know it also contributes to weakening of your bones? Aging of your skin?  Yellowing of your teeth and nails?  CALCIUM AND VITAMIN D:  Adequate intake of calcium and Vitamin D are recommended.  The recommendations for exact amounts of these supplements seem to change often, but generally speaking 600 mg of calcium (either carbonate or citrate) and 800 units of Vitamin D per day seems prudent. Certain women may benefit from higher intake of Vitamin D.  If you are among these women, your doctor will have told you during your visit.    PAP SMEARS:  Pap smears, to check for cervical cancer or precancers,  have traditionally been done yearly, although recent scientific advances have shown that most women can have pap smears less often.  However, every woman still should have a physical exam from her gynecologist every year. It will include a breast check, inspection of the vulva and vagina to check for abnormal growths or skin changes, a visual exam of the cervix, and then an exam to evaluate the size and shape of the uterus and  ovaries.  And after 46 years of age, a rectal exam is indicated to check for rectal cancers. We will also provide age appropriate advice regarding health maintenance, like when you should have certain vaccines, screening for sexually transmitted diseases, bone density testing, colonoscopy, mammograms, etc.   MAMMOGRAMS:  All women over 40 years old should have a yearly mammogram. Many facilities now offer a "3D" mammogram, which may cost around $50 extra out of pocket. If possible,  we recommend you accept the option to have the 3D mammogram performed.  It both reduces the number of women who will be called back for extra views which then turn out to be normal, and it is better than the routine mammogram at detecting truly abnormal areas.    COLONOSCOPY:  Colonoscopy to screen for colon cancer is recommended for all women at age 50.  We know, you hate the idea of the prep.  We agree, BUT, having colon cancer and not knowing it is worse!!  Colon cancer so often starts as a polyp that can be seen and removed at colonscopy, which can quite literally save your life!  And if your first colonoscopy is normal and you have no family history of colon cancer, most women don't have to have it again for 10 years.  Once every ten years, you can do something that may end up saving your life, right?  We will be happy to help you get it scheduled when you are ready.    Be sure to check your insurance coverage so you understand how much it will cost.  It may be covered as a preventative service at no cost, but you should check your particular policy.   SEEK MEDICAL CARE IF:   You have questions about any symptoms you are having.  You need a referral to a specialist (gynecologist, psychiatrist, or psychologist). SEEK IMMEDIATE MEDICAL CARE IF:   You have vaginal bleeding.  Your period lasts longer than 8 days.  Your periods are recurring sooner than 21 days.  You have bleeding after intercourse.  You have severe  depression.  You have pain when you urinate.  You have severe headaches.  You have vision problems. Document Released: 03/15/2004 Document Revised: 11/26/2012 Document Reviewed: 09/04/2012 [facility] Patient Information 2015 [place], [place]. This information is not intended to replace advice given to you by your health care provider. Make sure you discuss any questions you have with your health care provider.

## 2014-03-30 NOTE — Progress Notes (Signed)
44 y.o. [id number] Married  Caucasian Fe here for annual exam. Menopausal no vaginal bleeding or vaginal dryness. Denies hot flashes except occasional now, some sleep changes, no insomnia. Sees cardiology today for PVC's occurring again. Patient feels like she is having PMS symptoms with menopause, not myself. Some memory changes, but always have made lists. No problems with driving or household things or names. Eating well, no issues with appetite. Patient feels like it is just menopause. Seeing cardiology today as we discussed last year for evaluation prior to discussing HRT. Patient will have copy of evaluation sent to [place]. Sees PCP prn. No other health concerns today.  Patient's last menstrual period was 05/20/2012.          Sexually active: Yes.    The current method of family planning is post menopausal status.    Exercising: No.  exercise Smoker:  no  Health Maintenance: Pap:  03-27-13 neg, no endos MMG:  04-20-13 category c density, birads 1:neg Colonoscopy:  none BMD:   none TDaP: unsure will check with PCP on date Labs: Hgb-12.5 Self breast exam: done occ   reports that she has never smoked. She does not have any smokeless tobacco history on file. She reports that she does not drink alcohol or use illicit drugs.  Past Medical History  Diagnosis Date  . 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 Hyperlipidemia     Past Surgical History  Procedure Laterality Date  . Cesarean section  1996  . Dilation and evacuation  1998  . Umbilical hernia repair  2006    with mesh--Dr. Deon Pilling    Current Outpatient Prescriptions  Medication Sig Dispense Refill  . Aspirin-Salicylamide-Caffeine (BC HEADACHE POWDER PO) Take by mouth as needed.    Marland Kitchen ibuprofen (ADVIL,MOTRIN) 200 MG tablet Take 200 mg by mouth every 6 (six) hours as needed.     No current facility-administered medications for this visit.    Family History  Problem Relation Age of Onset  . Diabetes Paternal Grandmother      adult onset  . Stroke Paternal Grandmother     deceased  . Hypertension Mother   . Hypertension Father   . Stroke Paternal Grandfather     deceased  . Heart attack Maternal Uncle     deceased age 77  . Heart attack Maternal Grandmother   . Heart attack Maternal Aunt   . Heart attack Maternal Grandfather   . Heart attack Maternal Uncle   . Heart attack Maternal Aunt     ROS:  Pertinent items are noted in HPI.  Otherwise, a comprehensive ROS was negative.  Exam:   BP 110/72 mmHg  Pulse 70  Resp 16  Ht 5' 3.5" (1.613 m)  Wt 119 lb (53.978 kg)  BMI 20.75 kg/m2  LMP 05/20/2012 Height: 5' 3.5" (161.3 cm) Ht Readings from Last 3 Encounters:  03/30/14 5' 3.5" (1.613 m)  11/27/13 5' 3.75" (1.619 m)  11/25/13 5' 3.75" (1.619 m)    General appearance: alert, cooperative and appears stated age Head: Normocephalic, without obvious abnormality, atraumatic Neck: no adenopathy, supple, symmetrical, trachea midline and thyroid normal to inspection and palpation Lungs: clear to auscultation bilaterally Breasts: normal appearance, no masses or tenderness, No nipple retraction or dimpling, No nipple discharge or bleeding, No axillary or supraclavicular adenopathy Heart: regular rate and rhythm Abdomen: soft, non-tender; no masses,  no organomegaly Extremities: extremities normal, atraumatic, no cyanosis or edema Skin: Skin color, texture, turgor normal. No  rashes or lesions Lymph nodes: Cervical, supraclavicular, and axillary nodes normal. No abnormal inguinal nodes palpated Neurologic: Grossly normal   Pelvic: External genitalia:  no lesions              Urethra:  normal appearing urethra with no masses, tenderness or lesions              Bartholin's and Skene's: normal                 Vagina: normal appearing vagina with normal color and discharge, no lesions              Cervix: normal, non tender no lesions              Pap taken: Yes.   Bimanual Exam:  Uterus:  normal size,  contour, position, consistency, mobility, non-tender              Adnexa: normal adnexa and no mass, fullness, tenderness               Rectovaginal: Confirms               Anus:  normal sphincter tone, no lesions  Chaperone present: Yes  A:  Well Woman with normal exam  Menopausal no HRT, symptomatic  Cardiology evaluation today for palpitations  Fasting labs  P:   Reviewed health and wellness pertinent to exam  Aware of need to evaluate if vaginal bleeding  Will await evaluation to discuss HRT again to see if she is candidate or discuss SSRI use if indicated  Labs: CMP,Lipid Panel, CBC,Vitamin D, TSH  Pap smear taken today with HPVHR   counseled on breast self exam, mammography screening, menopause, adequate intake of calcium and vitamin D, diet and exercise  return annually or prn  An After Visit Summary was printed and given to the patient.

## 2014-03-31 LAB — TSH: TSH: 1.027 u[IU]/mL (ref 0.350–4.500)

## 2014-03-31 LAB — VITAMIN D 25 HYDROXY (VIT D DEFICIENCY, FRACTURES): Vit D, 25-Hydroxy: 24 ng/mL — ABNORMAL LOW (ref 30–100)

## 2014-03-31 NOTE — Progress Notes (Signed)
Reviewed personally.  M. Suzanne Nathali Vent, MD.  

## 2014-04-01 LAB — IPS PAP TEST WITH HPV

## 2014-04-20 ENCOUNTER — Ambulatory Visit (INDEPENDENT_AMBULATORY_CARE_PROVIDER_SITE_OTHER): Payer: BLUE CROSS/BLUE SHIELD | Admitting: Obstetrics & Gynecology

## 2014-04-20 ENCOUNTER — Telehealth: Payer: Self-pay | Admitting: Obstetrics & Gynecology

## 2014-04-20 VITALS — BP 104/60 | HR 72 | Ht 63.5 in | Wt 120.8 lb

## 2014-04-20 DIAGNOSIS — IMO0002 Reserved for concepts with insufficient information to code with codable children: Secondary | ICD-10-CM

## 2014-04-20 DIAGNOSIS — L298 Other pruritus: Secondary | ICD-10-CM | POA: Diagnosis not present

## 2014-04-20 DIAGNOSIS — N941 Dyspareunia: Secondary | ICD-10-CM

## 2014-04-20 DIAGNOSIS — N898 Other specified noninflammatory disorders of vagina: Secondary | ICD-10-CM

## 2014-04-20 MED ORDER — ESTRADIOL 0.1 MG/GM VA CREA: TOPICAL_CREAM | VAGINAL | Status: DC

## 2014-04-20 MED ORDER — ESTRADIOL-NORETHINDRONE ACET 0.05-0.25 MG/DAY TD PTTW: 1.0000 | MEDICATED_PATCH | TRANSDERMAL | Status: DC

## 2014-04-20 NOTE — Progress Notes (Signed)
Subjective:     Patient ID: Crystal Macdonald, female   DOB: 09-Sep-1970, 44 y.o.   MRN: [id number]  HPI 44 yo G3P2A1 MWF here for recurrent vaginal discharge symptoms.  Pt has been seen at least twice for this in the past few months.  Pt used Metrogel for five nights and she used this with an "extra prescription" she had at home.  She reports the discharge is a little better but there is still intense itching.  Pt hasn't had a cycle for almost the past two years.  She had a elevated FSH with last visit in 10/15.  Pt does have family hx of CVD and saw Dr. Wynonia Lawman, cardiologist 03/30/14.  He advised estrogen would be ok for her.  She is concerned about bone health and mother's hx of osteoporosis.  She has not had a bone fracture as an adult.  Pt does want to talk about HRT today as well.    Pt reports significant pain with intercourse so reports not being sexually active at this time as well.  HRT options discussed.  Risks and benefits reviewed including increased risks for breast cancer, MI, DVT/PE, stroke.  Benefits of bone health, vaginal dryness, decreased hot flashes/night sweats all discussed.  All questions answered.    Review of Systems  Genitourinary: Positive for urgency (but no incontinence).  All other systems reviewed and are negative.      Objective:   Physical Exam  Constitutional: She is oriented to person, place, and time. She appears well-developed and well-nourished.  Abdominal: Soft. Bowel sounds are normal.  Genitourinary: Vagina normal and uterus normal. There is no rash, tenderness or lesion on the right labia. There is no rash, tenderness or lesion on the left labia. Cervix exhibits discharge. Right adnexum displays no mass, no tenderness and no fullness. Left adnexum displays no mass, no tenderness and no fullness.  Lymphadenopathy:       Right: No inguinal adenopathy present.       Left: No inguinal adenopathy present.  Neurological: She is alert and oriented to person,  place, and time.  Skin: Skin is warm and dry.  Psychiatric: She has a normal mood and affect.   Wet smear: Ph 4.5.  KOH: no whiff, no yeast  Saline:  No trich, atrophic cells    Assessment:     Vaginal itching Dyspareunia Menopausal symptoms     Plan:     Affirm pending Estrace vaginal cream 1 gram pv twice weekly.  Rx to pharmacy. Combipatch 50/250.  Pt will start 1/2 patch twice weekly.  Rx to pharmacy.  May need to increase depending on symptom improvement.   ~25 minutes spent with patient >50% of time was in face to face discussion of above including discussion of HRT options and risks/benefits.

## 2014-04-20 NOTE — Telephone Encounter (Signed)
Patient says "I have another infection and feels it is time to see Dr.Miller" No appointments available with Dr.Miller today or tomorrow. I offered patient to see NP. Patient said "I will wait a few days and see if this goes away" Patient agreed to have a nurse call her. Last seen 03/30/14.

## 2014-04-20 NOTE — Telephone Encounter (Signed)
Patient reports ongoing BV symptoms and requests office visit with Dr. Sabra Heck. Used last doses of Metrogel from prior prescription and feels improved but now having increased vaginal itching. Patient has 1 diflucan at home, but unsure if she should use it. States vaginal itching is "driving her crazy" and making her uncomfortable.  Patient declines appointment with another provider.  Scheduled office visit with Dr. Sabra Heck for today at 77. Patient agreeable.

## 2014-04-21 LAB — WET PREP BY MOLECULAR PROBE
CANDIDA SPECIES: NEGATIVE
Gardnerella vaginalis: NEGATIVE
TRICHOMONAS VAG: NEGATIVE

## 2014-04-22 ENCOUNTER — Ambulatory Visit
Admission: RE | Admit: 2014-04-22 | Discharge: 2014-04-22 | Disposition: A | Discharge status: Home / Self Care | Payer: BLUE CROSS/BLUE SHIELD | Source: Ambulatory Visit

## 2014-04-22 DIAGNOSIS — Z1231 Encounter for screening mammogram for malignant neoplasm of breast: Secondary | ICD-10-CM

## 2014-04-23 ENCOUNTER — Encounter: Payer: Self-pay | Admitting: Obstetrics & Gynecology

## 2014-07-29 ENCOUNTER — Ambulatory Visit: Payer: BLUE CROSS/BLUE SHIELD | Admitting: Obstetrics & Gynecology

## 2014-12-06 ENCOUNTER — Telehealth: Payer: Self-pay | Admitting: Obstetrics & Gynecology

## 2014-12-06 ENCOUNTER — Ambulatory Visit (INDEPENDENT_AMBULATORY_CARE_PROVIDER_SITE_OTHER): Payer: BLUE CROSS/BLUE SHIELD | Admitting: Obstetrics and Gynecology

## 2014-12-06 ENCOUNTER — Encounter: Payer: Self-pay | Admitting: Obstetrics and Gynecology

## 2014-12-06 VITALS — BP 108/76 | HR 64 | Resp 14 | Wt 113.0 lb

## 2014-12-06 DIAGNOSIS — R6882 Decreased libido: Secondary | ICD-10-CM | POA: Diagnosis not present

## 2014-12-06 DIAGNOSIS — N950 Postmenopausal bleeding: Secondary | ICD-10-CM

## 2014-12-06 DIAGNOSIS — G4700 Insomnia, unspecified: Secondary | ICD-10-CM | POA: Diagnosis not present

## 2014-12-06 NOTE — Progress Notes (Signed)
Patient ID: Crystal Macdonald, female   DOB: 1970-07-10, 44 y.o.   MRN: [id number] GYNECOLOGY  VISIT   HPI: 44 y.o.   Married  Caucasian  female   [phone] with Patient's last menstrual period was 05/20/2012.   here c/o post menopause bleeding that started 2 days ago. She is not on HRT.  She started spotting 2 days ago, then turned into menstrual like bleeding. She was saturating a super tampon every 2-3 hours yesterday. Today is a little lighter. She is having slight cramping. Feels bloated, pelvic heaviness.  Rarely sexually active secondary to vaginal discomfort. She was given a script for vaginal estrogen, never used it, no real desire. Not sleeping well, she wakes up multiple times a night, can't always fall back asleep. Mind is always going.   GYNECOLOGIC HISTORY: Patient's last menstrual period was 05/20/2012. Contraception:post menopause Menopausal hormone therapy: N/A        OB History    Gravida Para Term Preterm AB TAB SAB Ectopic Multiple Living   3 2 2  1  1   2          Patient Active Problem List   Diagnosis Date Noted  . Other and unspecified hyperlipidemia 09/12/2012    Past Medical History  Diagnosis Date  . 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 Hyperlipidemia     Past Surgical History  Procedure Laterality Date  . Cesarean section  1996  . Dilation and evacuation  1998  . Umbilical hernia repair  2006    with mesh--Dr. Deon Pilling    Current Outpatient Prescriptions  Medication Sig Dispense Refill  . Aspirin-Salicylamide-Caffeine (BC HEADACHE POWDER PO) Take by mouth as needed.    Marland Kitchen ibuprofen (ADVIL,MOTRIN) 200 MG tablet Take 200 mg by mouth every 6 (six) hours as needed.     No current facility-administered medications for this visit.     ALLERGIES: Review of patient's allergies indicates no known allergies.  Family History  Problem Relation Age of Onset  . Diabetes Paternal Grandmother     adult onset  . Stroke Paternal Grandmother      deceased  . Hypertension Mother   . Hypertension Father   . Stroke Paternal Grandfather     deceased  . Heart attack Maternal Uncle     deceased age 74  . Heart attack Maternal Grandmother   . Heart attack Maternal Aunt   . Heart attack Maternal Grandfather   . Heart attack Maternal Uncle   . Heart attack Maternal Aunt     Social History   Social History  . Marital Status: Married    Spouse Name: N/A  . Number of Children: N/A  . Years of Education: N/A   Occupational History  . Not on file.   Social History Main Topics  . Smoking status: Never Smoker   . Smokeless tobacco: Never Used  . Alcohol Use: No  . Drug Use: No  . Sexual Activity:    Partners: Male    Birth Control/ Protection:    Other Topics Concern  . Not on file   Social History Narrative    Review of Systems  Genitourinary:       Postmenopause bleeding  All other systems reviewed and are negative.   PHYSICAL EXAMINATION:    BP 108/76 mmHg  Pulse 64  Resp 14  Wt 113 lb (51.256 kg)  LMP 05/20/2012    General appearance: alert, cooperative and appears stated age Neck:  no adenopathy, supple, symmetrical, trachea midline and thyroid normal to inspection and palpation Abdomen: soft, non-tender; bowel sounds normal; no masses,  no organomegaly  Pelvic: External genitalia:  no lesions              Urethra:  normal appearing urethra with no masses, tenderness or lesions              Bartholins and Skenes: normal                 Vagina: normal appearing vagina with normal color and discharge, no lesions              Cervix: no lesions              Bimanual Exam:  Uterus:  normal size, contour, position, consistency, mobility, non-tender and anteverted              Adnexa: no mass, fullness, tenderness             The risks of endometrial biopsy were reviewed and a consent was obtained.  A speculum was placed in the vagina and the cervix was cleansed with betadine. A tenaculum was placed on the  cervix and the mini-pipelle was placed into the endometrial cavity. The uterus sounded to 7-8cm. The endometrial biopsy was performed, moderate tissue/blood was obtained, a second pass was made. The tenaculum and speculum were removed. There were no complications.    Chaperone was present for exam.  ASSESSMENT Postmenopausal bleeding Insomnia Low libido  PLAN Endometrial biopsy Return for an ultrasound, possible sonohysterogram with Dr Sabra Heck Discussed learning relaxation techniques, can try OTC sleep aids Discussed the option of checking a testosterone level   An After Visit Summary was printed and given to the patient.

## 2014-12-06 NOTE — Telephone Encounter (Signed)
Spoke with patient. She states Saturday night she began to have spotting and started having heavier vaginal bleeding into Sunday and now changing pad q 2-3 hours.  Post menopausal, last [place] 168.5 04/06/13  On HRT, Combipatch.  Office visit today with Dr. Talbert Nan at 0900. Patient agreeable.   Routing to provider for final review. Patient agreeable to disposition. Will close encounter.

## 2014-12-06 NOTE — Patient Instructions (Signed)

## 2014-12-06 NOTE — Telephone Encounter (Signed)
Patient is in menopause and has started bleeding. She states it is just like a full blown period.

## 2014-12-07 ENCOUNTER — Telehealth: Payer: Self-pay | Admitting: Obstetrics and Gynecology

## 2014-12-07 ENCOUNTER — Telehealth: Payer: Self-pay

## 2014-12-07 DIAGNOSIS — N950 Postmenopausal bleeding: Secondary | ICD-10-CM

## 2014-12-07 MED ORDER — MEDROXYPROGESTERONE ACETATE 5 MG PO TABS: 5.0000 mg | ORAL_TABLET | Freq: Every day | ORAL | Status: DC

## 2014-12-07 NOTE — Telephone Encounter (Signed)
-----   Message from Crystal Dom, MD sent at 12/07/2014 10:58 AM EDT ----- Please inform the patient that her endometrial biopsy is benign, but shows hormonal stimulation. Please call in a script for provera 5 mg x 5 days. This will stabilize the endometrium and cause her to shed to the correct level. F/U with Dr Sabra Heck for the ultrasound as ordered.

## 2014-12-07 NOTE — Telephone Encounter (Signed)
Spoke with patient. Advised of message as seen below from [place]. Patient is agreeable and verbalizes understanding. Rx for Provera 5 mg #5 0RF sent to pharmacy on file. Patient would like to schedule ultrasound and possible SHGM  at this time as well. Appointment scheduled for 12/16/2014 at 1pm with 1:30pm consult with Dr.Miller. Patient is agreeable to date and time. Order will need to be precerted.  Cc: Theresia Lo Dr.Miller  Routing to provider for final review. Patient agreeable to disposition. Will close encounter.

## 2014-12-07 NOTE — Telephone Encounter (Signed)
Spoke with patient. Advised of message as seen below from [place]. Patient verbalizes understanding. "My bleeding is a lot less since I came in. Today it is much lighter. Do I still need to take the medication?" Advised of indication for taking the medication being to stabilize the endometrium due to increased lining thickness from hormonal stimulation. Provera will cause her to shed that lining and bring it to the correct level. 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 Asking if this will be okay. "Of course if I need to take it now I will but if I could wait that would be better." Advised I will speak with Dr.Jertson and return call with further recommendations.

## 2014-12-07 NOTE — Telephone Encounter (Signed)
Patient returned call. Reviewed benefit. Patient understood and agreeable. Verified appt date/time. Ok to close.

## 2014-12-07 NOTE — Telephone Encounter (Signed)
It should actually help her stop bleeding. Have her start it, so that she finishes it at the end of her week. I don't anticipate she will bleed heavily and she shouldn't bleed until after she finishes the provera.

## 2014-12-07 NOTE — Telephone Encounter (Signed)
Called patient to review benefits for procedure. Left voicemail to call back and review. °

## 2014-12-16 ENCOUNTER — Other Ambulatory Visit: Payer: Self-pay | Admitting: Obstetrics & Gynecology

## 2014-12-16 ENCOUNTER — Ambulatory Visit (INDEPENDENT_AMBULATORY_CARE_PROVIDER_SITE_OTHER): Payer: BLUE CROSS/BLUE SHIELD

## 2014-12-16 ENCOUNTER — Ambulatory Visit (INDEPENDENT_AMBULATORY_CARE_PROVIDER_SITE_OTHER): Payer: BLUE CROSS/BLUE SHIELD | Admitting: Obstetrics & Gynecology

## 2014-12-16 VITALS — BP 118/78 | HR 60 | Resp 16 | Wt 113.0 lb

## 2014-12-16 DIAGNOSIS — N950 Postmenopausal bleeding: Secondary | ICD-10-CM | POA: Diagnosis not present

## 2014-12-16 DIAGNOSIS — N841 Polyp of cervix uteri: Secondary | ICD-10-CM

## 2014-12-16 DIAGNOSIS — N951 Menopausal and female climacteric states: Secondary | ICD-10-CM | POA: Diagnosis not present

## 2014-12-16 NOTE — Progress Notes (Signed)
44 y.o. Crystal Macdonald Marriedfemale here for a pelvic ultrasound with sonohystogram due to PMP bleeding.  Pt experienced an episode of bleeding starting 12/04/14.  This was heavy and like a menstrual cycle at times.  Endometrial biopsy was performed showing:  Endometrium, biopsy - DEGENERATING SECRETORY ENDOMETRIUM. - NO HYPERPLASIA OR MALIGNANCY.  Pt was treated with Provera challenge for five days.  She completed this about three or four days ago and is bleeding again today.  Bleeding is dark and old appearing today and is not heavy.  Patient's last menstrual period was 05/20/2012.  Contraception: none ([place] 2014 195 and 2015 168)  Technique:  Both transabdominal and transvaginal ultrasound examinations of the pelvis were performed. Transabdominal technique was performed for global imaging of the pelvis including uterus, ovaries, adnexal regions, and pelvic cul-de-sac.  It was necessary to proceed with endovaginal exam following the abdominal ultrasound transabdominal exam to visualize the endometrium and adnexa.  Color and duplex Doppler ultrasound was utilized to evaluate blood flow to the ovaries.    FINDINGS: Uterus: 7 x 5 x 3 Endometrium: measured in several locations, all <94mm Adnexa:  Left: 1.7 x 0.9 x 1.1cm     Right: 2.5 x 1.7 x 3.2NV with 11mm follicle Cul de sac: no free fluid  SHSG:  After obtaining appropriate verbal consent from patient, the cervix was visualized using a speculum, and prepped with betadine.  A tenaculum  was applied to the cervix.  Dilation of the cervix was not necessary. The catheter was passed into the uterus and sterile saline introduced, with the following findings:  No filling defects  Images reviewed with pt.  Some calcifications appears present within the endometrium as well.  Large cervical polyp noted on physical exam which was not seen at visit on 10/17 with Dr. Talbert Nan.  Pt did have normal Pap 2/16.    Removal of polyp discussed.  Due to size, would  recommend in the OR with hysteroscopy, polyp resection, and D&C.  Procedure discussed.  Recovery and pain management discussed.  Risks discussed including but not limited to bleeding, rare risk of transfusion, infection, 1% risk of uterine perforation with risks of fluid deficit causing cardiac arrythmia, cerebral swelling and/or need to stop procedure early.  Fluid emboli and rare risk of death discussed.  DVT/PE, rare risk of risk of bowel/bladder/ureteral/vascular injury.  Patient aware if pathology abnormal she may need additional treatment.    She is not sure that she wants schedule and I am not sure the polyp is the source of bleeding.  Feel, also, could watch and see if she has any additional bleeding.  Would repeat assessment and make additional recommendations at that time.    As current bleeding is a result of the provera challenge, I fell it is ok to watch.    Pt reports she is having some issues with being in menopause.  Feels "crazy" and "emotional" at times.  Is having vaginal dryness.  Not sure she wants to be on HRT.  Reviewed risks and benefits.  Helpful to know that other women experience these same symptoms.  For now, just wants to continue to monitor.  Assessment:  Postmenopausal bleeding with negative endometrial biopsy Cervical polyp Menopausal symptoms  Plan:  Pt knows to call with any future bleeding.  Would consider polyp removal at that time.    Pt also knows to call if desires to initiate any treatment for menopausal symptoms.  ~30 minutes spent with patient >50% of time was in face  to face discussion of above.

## 2014-12-22 ENCOUNTER — Encounter: Payer: Self-pay | Admitting: Obstetrics & Gynecology

## 2014-12-22 DIAGNOSIS — N841 Polyp of cervix uteri: Secondary | ICD-10-CM | POA: Insufficient documentation

## 2015-02-08 ENCOUNTER — Telehealth: Payer: Self-pay | Admitting: Obstetrics & Gynecology

## 2015-02-08 NOTE — Telephone Encounter (Signed)
Patient has been using the combipatch patch for a few months now and she said that she has been spotting  (brown d/c) ever since she started using them and wants to know if it's okay for her to stop using them. Also patient says Dr. Sabra Heck said a nurse was going to call patient and schedule a OV in February with Dr. Sabra Heck and she never got a call. Best # to reach: [phone]

## 2015-02-08 NOTE — Telephone Encounter (Signed)
Patient was last seen for [facility] with Dr.Miller on 12/16/2014. Please see OV note plan below. No follow up indicated. Routing to [place] for review and advise of follow up appointment.  Plan: Pt knows to call with any future bleeding. Would consider polyp removal at that time.   Pt also knows to call if desires to initiate any treatment for menopausal symptoms.  ~30 minutes spent with patient >50% of time was in face to face discussion of above.

## 2015-02-08 NOTE — Telephone Encounter (Signed)
She needs AEX in 2/17.  Ok to stop the combipatch before she returns for the AEX.  If she is still spotting when she comes in, I will want to remove the cervical polyp.  Thanks.

## 2015-02-09 NOTE — Telephone Encounter (Signed)
Spoke with patient. Advised of message as seen below from [place]. Patient is agreeable. Will stop Combipatch at this time. Aex scheduled for 04/05/2015 at 2:30 pm with Dr.Miller. Agreeable to date and time.  Routing to provider for final review. Patient agreeable to disposition. Will close encounter.

## 2015-03-29 ENCOUNTER — Other Ambulatory Visit: Payer: Self-pay

## 2015-03-29 DIAGNOSIS — Z1231 Encounter for screening mammogram for malignant neoplasm of breast: Secondary | ICD-10-CM

## 2015-04-05 ENCOUNTER — Encounter: Payer: Self-pay | Admitting: Obstetrics & Gynecology

## 2015-04-05 ENCOUNTER — Ambulatory Visit (INDEPENDENT_AMBULATORY_CARE_PROVIDER_SITE_OTHER): Payer: BLUE CROSS/BLUE SHIELD | Admitting: Obstetrics & Gynecology

## 2015-04-05 VITALS — BP 100/70 | HR 70 | Resp 16 | Ht 63.75 in | Wt 111.0 lb

## 2015-04-05 DIAGNOSIS — Z01419 Encounter for gynecological examination (general) (routine) without abnormal findings: Secondary | ICD-10-CM

## 2015-04-05 DIAGNOSIS — Z0000 Encounter for general adult medical examination without abnormal findings: Secondary | ICD-10-CM

## 2015-04-05 LAB — POCT URINALYSIS DIPSTICK
Bilirubin, UA: NEGATIVE
Blood, UA: NEGATIVE
Glucose, UA: NEGATIVE
Ketones, UA: NEGATIVE
Nitrite, UA: NEGATIVE
Protein, UA: NEGATIVE
Urobilinogen, UA: NEGATIVE
pH, UA: 5

## 2015-04-05 LAB — CBC
HCT: 39.6 % (ref 36.0–46.0)
Hemoglobin: 13.1 g/dL (ref 12.0–15.0)
MCH: 28.9 pg (ref 26.0–34.0)
MCHC: 33.1 g/dL (ref 30.0–36.0)
MCV: 87.2 fL (ref 78.0–100.0)
MPV: 9.3 fL (ref 8.6–12.4)
PLATELETS: 242 10*3/uL (ref 150–400)
RBC: 4.54 MIL/uL (ref 3.87–5.11)
RDW: 13.9 % (ref 11.5–15.5)
WBC: 7.3 10*3/uL (ref 4.0–10.5)

## 2015-04-05 LAB — LIPID PANEL
Cholesterol: 238 mg/dL — ABNORMAL HIGH (ref 125–200)
HDL: 71 mg/dL (ref >=46)
LDL CALC: 157 mg/dL — AB (ref <130)
Total CHOL/HDL Ratio: 3.4 Ratio (ref <=5.0)
Triglycerides: 52 mg/dL (ref <150)
VLDL: 10 mg/dL (ref <30)

## 2015-04-05 MED ORDER — ESTRADIOL 10 MCG VA TABS: 1.0000 | ORAL_TABLET | VAGINAL | Status: DC

## 2015-04-05 NOTE — Progress Notes (Signed)
45 y.o. [id number] MarriedCaucasianF here for annual exam.  Pt reports after she started the [place] last year, she spotted and bled for several weeks.  She didn't call and stopped the patch at the end of the year so hasn't had any bleeding since.  Pt reports she felt better on the Combipatch.  Her biggest complaint is sleep.   Pt also has complaint of vulvar and vaginal itching.  States this has occurred for the last three years (which only discussed in regards to vaginal itching in 3/16).  This did correspond to her start of menopause.  She did use vaginal estrogen cream for a short while last year but she didn't like it due to the "messiness".  She states this was better with Combipatch.  She just was frustrated with the spotting but she didn't call until she was having a "full blown cycle in October".  Biopsy and ultrasound was performed.  She indicated, then, that she would most likely stop the HRT at the ultrasound visit due to feeling "moody".  Then she continued until December and only called back then to report she was really frustrated and was stopping the HRT.  Discussed importance of communication not just when she is completely frustrated.    As far as vaginal and vulvar itching are concerned, D/W pt vagifem and osphena options as well.  I think Gilberto Better would work better for the vulvar itching but she would like to try Vagifem.  Instructions for use provided.  Advised pt, we need to communicate much more regularly about this and not just at AEX to get to a resolution of this for her.  PCP:  Dr. Moreen Fowler.  Last seen three years ago.   Patient's last menstrual period was 05/20/2012.          Sexually active: Yes.    The current method of family planning is post menopausal status.    Exercising: No.  The patient does not participate in regular exercise at present. Smoker:  no  Health Maintenance: Pap: 03/30/14 Neg. HR HPV:neg History of abnormal Pap:  no MMG:  04/22/14 BIRADS1:neg Colonoscopy:   Never BMD:   Never TDaP:  w/ PCP Screening Labs: Here, Hb today: pending, Urine today: WBC=Small(+)   reports that she has never smoked. She has never used smokeless tobacco. She reports that she does not drink alcohol or use illicit drugs.  Past Medical History  Diagnosis Date  . 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 Hyperlipidemia     Past Surgical History  Procedure Laterality Date  . Cesarean section  1996  . Dilation and evacuation  1998  . Umbilical hernia repair  2006    with mesh--Dr. Deon Pilling    Current Outpatient Prescriptions  Medication Sig Dispense Refill  . Aspirin-Salicylamide-Caffeine (BC HEADACHE POWDER PO) Take by mouth as needed.    Marland Kitchen ibuprofen (ADVIL,MOTRIN) 200 MG tablet Take 200 mg by mouth every 6 (six) hours as needed.    Marland Kitchen omeprazole (PRILOSEC) 40 MG capsule Take 40 mg by mouth daily.  1   No current facility-administered medications for this visit.    Family History  Problem Relation Age of Onset  . Diabetes Paternal Grandmother     adult onset  . Stroke Paternal Grandmother     deceased  . Hypertension Mother   . Hypertension Father   . Stroke Paternal Grandfather     deceased  . Heart attack Maternal Uncle  deceased age 71  . Heart attack Maternal Grandmother   . Heart attack Maternal Aunt   . Heart attack Maternal Grandfather   . Heart attack Maternal Uncle   . Heart attack Maternal Aunt     ROS:  Pertinent items are noted in HPI.  Otherwise, a comprehensive ROS was negative.  Exam:   BP 100/70 mmHg  Pulse 70  Resp 16  Ht 5' 3.75" (1.619 m)  Wt 111 lb (50.349 kg)  BMI 19.21 kg/m2  LMP 05/20/2012  Weight change: -8#  Height: 5' 3.75" (161.9 cm)  Ht Readings from Last 3 Encounters:  04/05/15 5' 3.75" (1.619 m)  04/20/14 5' 3.5" (1.613 m)  03/30/14 5' 3.5" (1.613 m)    General appearance: alert, cooperative and appears stated age Head: Normocephalic, without obvious abnormality, atraumatic Neck: no  adenopathy, supple, symmetrical, trachea midline and thyroid normal to inspection and palpation Lungs: clear to auscultation bilaterally Breasts: normal appearance, no masses or tenderness Heart: regular rate and rhythm Abdomen: soft, non-tender; bowel sounds normal; no masses,  no organomegaly Extremities: extremities normal, atraumatic, no cyanosis or edema Skin: Skin color, texture, turgor normal. No rashes or lesions Lymph nodes: Cervical, supraclavicular, and axillary nodes normal. No abnormal inguinal nodes palpated Neurologic: Grossly normal   Pelvic: External genitalia:  no lesions              Urethra:  normal appearing urethra with no masses, tenderness or lesions              Bartholins and Skenes: normal                 Vagina: normal appearing vagina with normal color and discharge, no lesions              Cervix: no lesions              Pap taken: No. Bimanual Exam:  Uterus:  normal size, contour, position, consistency, mobility, non-tender              Adnexa: normal adnexa and no mass, fullness, tenderness               Rectovaginal: Confirms               Anus:  normal sphincter tone, no lesions  Chaperone was present for exam.  A:  Well Woman with normal exam PMP with trial of HRT last year that was unsuccessful due to spotting Small polyp noted on ultrasound Vulvar and vaginal itching with no skin changes on exam.  Symptoms coincided with beginning of menopause for pt H/O colonic polyps Dense breasts Osteopenia  P: Mammogram yearly. D/W 3D MMG No pap smear today.  Neg pap with neg HR HPV 2/16 Vagifem 52meq pv twice weekly.  Pt needs to follow up in 3 months.  Dye free and fragrance free products reviewed.  Pt will change toilet paper as well to see if this helps. CBC, Lipids, Vit D today AEX 1 year or follow up prn

## 2015-04-06 LAB — HEMOGLOBIN, FINGERSTICK: HEMOGLOBIN, FINGERSTICK: 13.1 g/dL (ref 12.0–16.0)

## 2015-04-06 LAB — VITAMIN D 25 HYDROXY (VIT D DEFICIENCY, FRACTURES): Vit D, 25-Hydroxy: 24 ng/mL — ABNORMAL LOW (ref 30–100)

## 2015-04-25 ENCOUNTER — Ambulatory Visit
Admission: RE | Admit: 2015-04-25 | Discharge: 2015-04-25 | Disposition: A | Discharge status: Home / Self Care | Payer: BLUE CROSS/BLUE SHIELD | Source: Ambulatory Visit

## 2015-04-25 DIAGNOSIS — Z1231 Encounter for screening mammogram for malignant neoplasm of breast: Secondary | ICD-10-CM

## 2015-06-26 DIAGNOSIS — M5412 Radiculopathy, cervical region: Secondary | ICD-10-CM | POA: Diagnosis not present

## 2015-07-12 ENCOUNTER — Ambulatory Visit (INDEPENDENT_AMBULATORY_CARE_PROVIDER_SITE_OTHER): Payer: BLUE CROSS/BLUE SHIELD | Admitting: Obstetrics & Gynecology

## 2015-07-12 ENCOUNTER — Encounter: Payer: Self-pay | Admitting: Obstetrics & Gynecology

## 2015-07-12 VITALS — BP 104/66 | HR 68 | Resp 14 | Ht 64.0 in | Wt 112.2 lb

## 2015-07-12 DIAGNOSIS — L298 Other pruritus: Secondary | ICD-10-CM

## 2015-07-12 DIAGNOSIS — N898 Other specified noninflammatory disorders of vagina: Secondary | ICD-10-CM

## 2015-07-12 NOTE — Progress Notes (Signed)
GYNECOLOGY  VISIT   HPI: 45 y.o. G66P2012 Married Caucasian female with complaint of vaginal irritation and itching.  She's also had dryness.  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  She never started the vagifem after market "just because I didn't".  She reports the dryness has been present since December but she feels there is a new discharge over the last three weeks.  Denies vaginal odor.  No new sexual partners.  No urinary symptoms.  No pelvic pain.   GYNECOLOGIC HISTORY: Patient's last menstrual period was 05/20/2012. Contraception: PMP  Patient Active Problem List   Diagnosis Date Noted  . Cervical polyp 12/22/2014  . Other and unspecified hyperlipidemia 09/12/2012    Past Medical History  Diagnosis Date  . 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 Hyperlipidemia     Past Surgical History  Procedure Laterality Date  . Cesarean section  1996  . Dilation and evacuation  1998  . Umbilical hernia repair  2006    with mesh--Dr. Deon Pilling    MEDS:  Reviewed in EPIC and UTD  ALLERGIES: Review of patient's allergies indicates no known allergies.  Family History  Problem Relation Age of Onset  . Diabetes Paternal Grandmother     adult onset  . Stroke Paternal Grandmother     deceased  . Hypertension Mother   . Hypertension Father   . Stroke Paternal Grandfather     deceased  . Heart attack Maternal Uncle     deceased age 68  . Heart attack Maternal Grandmother   . Heart attack Maternal Aunt   . Heart attack Maternal Grandfather   . Heart attack Maternal Uncle   . Heart attack Maternal Aunt     SH:  Married, non-smoker  Review of Systems  All other systems reviewed and are negative.   PHYSICAL EXAMINATION:    BP 104/66 mmHg  Pulse 68  Resp 14  Ht 5\' 4"  (1.626 m)  Wt 112 lb 3.2 oz (50.894 kg)  BMI 19.25 kg/m2  LMP 05/20/2012    General appearance: alert,  cooperative and appears stated age Abdomen: soft, non-tender; bowel sounds normal; no masses,  no organomegaly  Pelvic: External genitalia:  no lesions              Urethra:  normal appearing urethra with no masses, tenderness or lesions              Bartholins and Skenes: normal                 Vagina: normal appearing vagina with normal color and scant discharge noted, no lesions              Cervix: no lesions              Bimanual Exam:  Uterus:  normal size, contour, position, consistency, mobility, non-tender              Adnexa: no mass, fullness, tenderness  Chaperone was present for exam.  Assessment: Vaginal discharge Vaginal itching PMP status, symptomatic but not interested in HRT due to bleeding with prior HRT  Plan: Affirm pending.  If this is negative, she will then start the vagifem

## 2015-07-13 LAB — WET PREP BY MOLECULAR PROBE
Candida species: NEGATIVE
Gardnerella vaginalis: NEGATIVE
Trichomonas vaginosis: NEGATIVE

## 2015-12-12 DIAGNOSIS — E7800 Pure hypercholesterolemia, unspecified: Secondary | ICD-10-CM | POA: Diagnosis not present

## 2015-12-12 DIAGNOSIS — Z23 Encounter for immunization: Secondary | ICD-10-CM | POA: Diagnosis not present

## 2015-12-12 DIAGNOSIS — Z0000 Encounter for general adult medical examination without abnormal findings: Secondary | ICD-10-CM | POA: Diagnosis not present

## 2016-04-11 ENCOUNTER — Other Ambulatory Visit: Payer: Self-pay | Admitting: Obstetrics & Gynecology

## 2016-04-11 DIAGNOSIS — Z1231 Encounter for screening mammogram for malignant neoplasm of breast: Secondary | ICD-10-CM

## 2016-04-30 ENCOUNTER — Ambulatory Visit
Admission: RE | Admit: 2016-04-30 | Discharge: 2016-04-30 | Disposition: A | Discharge status: Home / Self Care | Payer: BLUE CROSS/BLUE SHIELD | Source: Ambulatory Visit | Attending: Obstetrics & Gynecology | Admitting: Obstetrics & Gynecology

## 2016-04-30 DIAGNOSIS — Z1231 Encounter for screening mammogram for malignant neoplasm of breast: Secondary | ICD-10-CM | POA: Diagnosis not present

## 2016-06-15 ENCOUNTER — Ambulatory Visit (INDEPENDENT_AMBULATORY_CARE_PROVIDER_SITE_OTHER): Payer: BLUE CROSS/BLUE SHIELD | Admitting: Obstetrics & Gynecology

## 2016-06-15 ENCOUNTER — Encounter: Payer: Self-pay | Admitting: Obstetrics & Gynecology

## 2016-06-15 VITALS — BP 110/80 | HR 74 | Resp 14 | Ht 63.75 in | Wt 118.0 lb

## 2016-06-15 DIAGNOSIS — E559 Vitamin D deficiency, unspecified: Secondary | ICD-10-CM | POA: Diagnosis not present

## 2016-06-15 DIAGNOSIS — Z0000 Encounter for general adult medical examination without abnormal findings: Secondary | ICD-10-CM

## 2016-06-15 DIAGNOSIS — R5383 Other fatigue: Secondary | ICD-10-CM

## 2016-06-15 DIAGNOSIS — Z01419 Encounter for gynecological examination (general) (routine) without abnormal findings: Secondary | ICD-10-CM | POA: Diagnosis not present

## 2016-06-15 DIAGNOSIS — N951 Menopausal and female climacteric states: Secondary | ICD-10-CM | POA: Diagnosis not present

## 2016-06-15 LAB — CBC
HCT: 38.3 % (ref 35.0–45.0)
Hemoglobin: 12.7 g/dL (ref 11.7–15.5)
MCH: 29.4 pg (ref 27.0–33.0)
MCHC: 33.2 g/dL (ref 32.0–36.0)
MCV: 88.7 fL (ref 80.0–100.0)
MPV: 9 fL (ref 7.5–12.5)
PLATELETS: 256 10*3/uL (ref 140–400)
RBC: 4.32 MIL/uL (ref 3.80–5.10)
RDW: 14.2 % (ref 11.0–15.0)
WBC: 6.3 10*3/uL (ref 3.8–10.8)

## 2016-06-15 LAB — POCT URINALYSIS DIPSTICK
Bilirubin, UA: NEGATIVE
Glucose, UA: NEGATIVE
KETONES UA: NEGATIVE
Nitrite, UA: NEGATIVE
PH UA: 5 (ref 5.0–8.0)
PROTEIN UA: NEGATIVE
RBC UA: NEGATIVE

## 2016-06-15 LAB — COMPREHENSIVE METABOLIC PANEL
ALBUMIN: 4.6 g/dL (ref 3.6–5.1)
ALK PHOS: 54 U/L (ref 33–115)
ALT: 18 U/L (ref 6–29)
AST: 17 U/L (ref 10–35)
BUN: 14 mg/dL (ref 7–25)
CALCIUM: 9.5 mg/dL (ref 8.6–10.2)
CO2: 27 mmol/L (ref 20–31)
Chloride: 102 mmol/L (ref 98–110)
Creat: 0.74 mg/dL (ref 0.50–1.10)
Glucose, Bld: 76 mg/dL (ref 65–99)
POTASSIUM: 4.2 mmol/L (ref 3.5–5.3)
Sodium: 140 mmol/L (ref 135–146)
TOTAL PROTEIN: 7 g/dL (ref 6.1–8.1)
Total Bilirubin: 0.4 mg/dL (ref 0.2–1.2)

## 2016-06-15 MED ORDER — ESTRADIOL 0.025 MG/24HR TD PTTW: 1.0000 | MEDICATED_PATCH | TRANSDERMAL | 3 refills | Status: DC

## 2016-06-15 MED ORDER — PROGESTERONE MICRONIZED 100 MG PO CAPS: ORAL_CAPSULE | ORAL | 3 refills | Status: DC

## 2016-06-15 NOTE — Addendum Note (Signed)
Addended by: Polly Cobia on: 06/15/2016 03:50 PM   Modules accepted: Orders

## 2016-06-15 NOTE — Progress Notes (Addendum)
46 y.o. [id number] MarriedCaucasianF here for annual exam.  Doing well.  Denies vaginal bleeding.  Frustrated with fatigue and feeling "old".  Feels like she is having fat accumulation at her waist.  D/W pt typical menopausal changes.  Reports she felt really good when on HRT but didn't like bleeding.  Wants to discuss using this again.  Really liked how she felt, just didn't like having cycles again.  Patient's last menstrual period was 05/20/2012.          Sexually active: Yes.    The current method of family planning is post menopausal status.    Exercising: No.  The patient does not participate in regular exercise at present. Smoker:  no  Health Maintenance: Pap:  03/30/14 Neg. HR HPV:neg   03/27/13 Neg  History of abnormal Pap:  no MMG:  04/30/16 BIRADS1:neg  Colonoscopy:  Never TDaP: 11/2015 with PCP Screening Labs: PCP, Urine today: pending    reports that she has never smoked. She has never used smokeless tobacco. She reports that she does not drink alcohol or use drugs.  Past Medical History:  Diagnosis Date  . Hyperlipidemia   . 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 CESAREAN SECTION  1996  . DILATION AND EVACUATION  1998  . UMBILICAL HERNIA REPAIR  2006   with mesh--Dr. Deon Pilling    Current Outpatient Prescriptions  Medication Sig Dispense Refill  . Aspirin-Salicylamide-Caffeine (BC HEADACHE POWDER PO) Take by mouth as needed.    Marland Kitchen ibuprofen (ADVIL,MOTRIN) 200 MG tablet Take 200 mg by mouth every 6 (six) hours as needed.    Marland Kitchen omeprazole (PRILOSEC) 40 MG capsule Take 40 mg by mouth as needed.   1   No current facility-administered medications for this visit.     Family History  Problem Relation Age of Onset  . Diabetes Paternal Grandmother     adult onset  . Stroke Paternal Grandmother     deceased  . Hypertension Mother   . Hypertension Father   . Stroke Paternal Grandfather     deceased  . Heart  attack Maternal Uncle     deceased age 57  . Heart attack Maternal Grandmother   . Heart attack Maternal Aunt   . Heart attack Maternal Grandfather   . Heart attack Maternal Uncle   . Heart attack Maternal Aunt     ROS:  Pertinent items are noted in HPI.  Otherwise, a comprehensive ROS was negative.  Exam:   BP 110/80 (BP Location: Right Arm, Patient Position: Sitting, Cuff Size: Normal)   Pulse 74   Resp 14   Ht 5' 3.75" (1.619 m)   Wt 118 lb (53.5 kg)   LMP 05/20/2012   BMI 20.41 kg/m   Weight change:   +7#  Height: 5' 3.75" (161.9 cm)  Ht Readings from Last 3 Encounters:  06/15/16 5' 3.75" (1.619 m)  07/12/15 5\' 4"  (1.626 m)  04/05/15 5' 3.75" (1.619 m)    General appearance: alert, cooperative and appears stated age Head: Normocephalic, without obvious abnormality, atraumatic Neck: no adenopathy, supple, symmetrical, trachea midline and thyroid normal to inspection and palpation Lungs: clear to auscultation bilaterally Breasts: normal appearance, no masses or tenderness Heart: regular rate and rhythm Abdomen: soft, non-tender; bowel sounds normal; no masses,  no organomegaly Extremities: extremities normal, atraumatic, no cyanosis or edema Skin: Skin color, texture, turgor normal. No rashes or lesions Lymph nodes: Cervical,  supraclavicular, and axillary nodes normal. No abnormal inguinal nodes palpated Neurologic: Grossly normal   Pelvic: External genitalia:  no lesions              Urethra:  normal appearing urethra with no masses, tenderness or lesions              Bartholins and Skenes: normal                 Vagina: normal appearing vagina with normal color and discharge, no lesions              Cervix: no lesions              Pap taken: No. Bimanual Exam:  Uterus:  normal size, contour, position, consistency, mobility, non-tender              Adnexa: normal adnexa and no mass, fullness, tenderness               Rectovaginal: Confirms               Anus:   normal sphincter tone, no lesions  Chaperone was present for exam.  A:  Well Woman with normal exam PMP., no HRT Fatigue H/O colonic polyps Dense breast tissue  P:   Mammogram guidelines reviewed pap smear and neg HR HPV 2/16.  No pap obtained today D/w pt options for evaluation for fatigue.  Also, discussed with pt options for HRT.  She was on combipatch when bleeding started.  Could consider lower HRT dosage and see if this helps as well as doesn't cause bleeding.  Could consider IUD use with HRT.  She would like to restart HRT with lower dosage HRT.  Rx for Vivelle dot 0.025mg  patches twice weekly.  Prometrium 100mg  nightly days 1-15 each month.  Recheck 3 months.  Risks and benefits reviewed again. Ferritin, TSH, CMP, Vit D, CBC return annually or prn  ~In additional to AEX, approximately 15 additional minutes spent with patient discussing concerns about fatigue, body changes and HRT. All of this was in face to face discussion of above.

## 2016-06-16 LAB — VITAMIN D 25 HYDROXY (VIT D DEFICIENCY, FRACTURES): Vit D, 25-Hydroxy: 19 ng/mL — ABNORMAL LOW (ref 30–100)

## 2016-06-16 LAB — TSH: TSH: 1.28 m[IU]/L

## 2016-06-16 LAB — FERRITIN: FERRITIN: 38 ng/mL (ref 10–232)

## 2016-06-16 NOTE — Addendum Note (Signed)
Addended by: Megan Salon on: 06/16/2016 06:46 AM   Modules accepted: Orders

## 2016-06-18 ENCOUNTER — Other Ambulatory Visit: Payer: Self-pay | Admitting: *Deleted

## 2016-06-18 MED ORDER — VITAMIN D (ERGOCALCIFEROL) 1.25 MG (50000 UNIT) PO CAPS: 50000.0000 [IU] | ORAL_CAPSULE | ORAL | 0 refills | Status: DC

## 2016-07-20 ENCOUNTER — Ambulatory Visit: Payer: BLUE CROSS/BLUE SHIELD | Admitting: Obstetrics & Gynecology

## 2016-08-06 DIAGNOSIS — W540XXD Bitten by dog, subsequent encounter: Secondary | ICD-10-CM | POA: Diagnosis not present

## 2016-08-06 DIAGNOSIS — W540XXA Bitten by dog, initial encounter: Secondary | ICD-10-CM | POA: Diagnosis not present

## 2016-08-06 DIAGNOSIS — S61452A Open bite of left hand, initial encounter: Secondary | ICD-10-CM | POA: Diagnosis not present

## 2016-08-06 DIAGNOSIS — S61452D Open bite of left hand, subsequent encounter: Secondary | ICD-10-CM | POA: Diagnosis not present

## 2016-08-21 DIAGNOSIS — W540XXD Bitten by dog, subsequent encounter: Secondary | ICD-10-CM | POA: Diagnosis not present

## 2016-08-21 DIAGNOSIS — S61452D Open bite of left hand, subsequent encounter: Secondary | ICD-10-CM | POA: Diagnosis not present

## 2016-09-10 ENCOUNTER — Telehealth: Payer: Self-pay | Admitting: Obstetrics & Gynecology

## 2016-09-10 NOTE — Telephone Encounter (Signed)
Patient called and cancelled her 3 month recheck appointment on 10/15/16. She said she did not continue taking the medication for "hormone replacement" so doesn't need the appointment.

## 2016-09-13 DIAGNOSIS — S61452D Open bite of left hand, subsequent encounter: Secondary | ICD-10-CM | POA: Diagnosis not present

## 2016-09-13 DIAGNOSIS — W540XXD Bitten by dog, subsequent encounter: Secondary | ICD-10-CM | POA: Diagnosis not present

## 2016-09-14 ENCOUNTER — Ambulatory Visit: Payer: BLUE CROSS/BLUE SHIELD | Admitting: Obstetrics & Gynecology

## 2016-09-17 DIAGNOSIS — S61452D Open bite of left hand, subsequent encounter: Secondary | ICD-10-CM | POA: Diagnosis not present

## 2016-09-17 DIAGNOSIS — W540XXD Bitten by dog, subsequent encounter: Secondary | ICD-10-CM | POA: Diagnosis not present

## 2016-09-17 DIAGNOSIS — S61259D Open bite of unspecified finger without damage to nail, subsequent encounter: Secondary | ICD-10-CM | POA: Diagnosis not present

## 2016-09-24 DIAGNOSIS — W540XXD Bitten by dog, subsequent encounter: Secondary | ICD-10-CM | POA: Diagnosis not present

## 2016-09-24 DIAGNOSIS — S61452D Open bite of left hand, subsequent encounter: Secondary | ICD-10-CM | POA: Diagnosis not present

## 2016-10-01 DIAGNOSIS — S61452D Open bite of left hand, subsequent encounter: Secondary | ICD-10-CM | POA: Diagnosis not present

## 2016-10-01 DIAGNOSIS — W540XXD Bitten by dog, subsequent encounter: Secondary | ICD-10-CM | POA: Diagnosis not present

## 2016-10-04 DIAGNOSIS — S61452D Open bite of left hand, subsequent encounter: Secondary | ICD-10-CM | POA: Diagnosis not present

## 2016-10-04 DIAGNOSIS — W540XXD Bitten by dog, subsequent encounter: Secondary | ICD-10-CM | POA: Diagnosis not present

## 2016-10-23 ENCOUNTER — Other Ambulatory Visit (INDEPENDENT_AMBULATORY_CARE_PROVIDER_SITE_OTHER): Payer: BLUE CROSS/BLUE SHIELD

## 2016-10-23 ENCOUNTER — Other Ambulatory Visit: Payer: Self-pay

## 2016-10-23 ENCOUNTER — Telehealth: Payer: Self-pay | Admitting: *Deleted

## 2016-10-23 DIAGNOSIS — E559 Vitamin D deficiency, unspecified: Secondary | ICD-10-CM

## 2016-10-23 NOTE — Telephone Encounter (Signed)
-----   Message from Megan Salon, MD sent at 10/22/2016 12:16 AM EDT ----- Regarding: Vit D Pt hasn't returned for Vit D recheck.  Can you call her please and see if she's done this elsewhere?  Thanks.  Vinnie Level

## 2016-10-23 NOTE — Telephone Encounter (Signed)
Call to patient. Patient states she has not had her vitamin d level rechecked. Patient states however, she was bitten by a dog back in June and as a result, taking any medication besides her antibiotic "fell to the wayside." Patient states she probably only took 8 out of the 12 weeks of vitamin d, but "I've been in the sun a lot this summer." Vitamin d recheck scheduled for Tuesday 10/23/16 at 1130. Patient agreeable to date and time of appointment. Future order present for lab work.   Patient agreeable to disposition. Will close encounter.

## 2016-10-23 NOTE — Telephone Encounter (Signed)
Message left to return call to Imane Burrough at [phone].    

## 2016-10-23 NOTE — Telephone Encounter (Signed)
Patient returned Crystal Macdonald's call and reported that she has not had her vitamin D tested anywhere else.

## 2016-10-24 LAB — VITAMIN D 25 HYDROXY (VIT D DEFICIENCY, FRACTURES): VIT D 25 HYDROXY: 23 ng/mL — AB (ref 30.0–100.0)

## 2016-10-30 ENCOUNTER — Telehealth: Payer: Self-pay | Admitting: *Deleted

## 2016-10-30 MED ORDER — VITAMIN D (ERGOCALCIFEROL) 1.25 MG (50000 UNIT) PO CAPS: 50000.0000 [IU] | ORAL_CAPSULE | ORAL | 3 refills | Status: DC

## 2016-10-30 NOTE — Telephone Encounter (Signed)
Call to patient. Results reviewed with patient and she verbalized understanding. Per Dr. Sabra Heck, refill Vitamin D prescription until aex in 06/2017. Patient requests prescription for vitamin d [facility] on [address]. Vitamin d 50000 IU #12, 3RF called in to preferred pharmacy.   Patient agreeable to disposition. Will close encounter.

## 2016-10-30 NOTE — Telephone Encounter (Signed)
-----   Message from Megan Salon, MD sent at 10/30/2016  1:48 PM EDT ----- Please let pt know her Vit D is a little better at 23.  Should stay on the prescription dosage 50K weekly.  Ok to send rx into pharmacy.

## 2016-11-08 DIAGNOSIS — S61452D Open bite of left hand, subsequent encounter: Secondary | ICD-10-CM | POA: Diagnosis not present

## 2016-11-08 DIAGNOSIS — W540XXD Bitten by dog, subsequent encounter: Secondary | ICD-10-CM | POA: Diagnosis not present

## 2017-04-23 DIAGNOSIS — S46812A Strain of other muscles, fascia and tendons at shoulder and upper arm level, left arm, initial encounter: Secondary | ICD-10-CM | POA: Diagnosis not present

## 2017-04-23 DIAGNOSIS — L814 Other melanin hyperpigmentation: Secondary | ICD-10-CM | POA: Diagnosis not present

## 2017-04-23 DIAGNOSIS — L719 Rosacea, unspecified: Secondary | ICD-10-CM | POA: Diagnosis not present

## 2017-04-30 ENCOUNTER — Ambulatory Visit: Payer: BLUE CROSS/BLUE SHIELD | Admitting: Obstetrics & Gynecology

## 2017-04-30 ENCOUNTER — Encounter: Payer: Self-pay | Admitting: Obstetrics & Gynecology

## 2017-04-30 ENCOUNTER — Telehealth: Payer: Self-pay | Admitting: Obstetrics & Gynecology

## 2017-04-30 VITALS — BP 112/68 | HR 68 | Temp 97.9°F | Resp 14 | Wt 118.0 lb

## 2017-04-30 DIAGNOSIS — N3090 Cystitis, unspecified without hematuria: Secondary | ICD-10-CM

## 2017-04-30 DIAGNOSIS — R300 Dysuria: Secondary | ICD-10-CM

## 2017-04-30 LAB — POCT URINALYSIS DIPSTICK
Bilirubin, UA: NEGATIVE
GLUCOSE UA: NEGATIVE
Ketones, UA: NEGATIVE
NITRITE UA: POSITIVE
Urobilinogen, UA: 0.2 E.U./dL
pH, UA: 8 (ref 5.0–8.0)

## 2017-04-30 MED ORDER — SULFAMETHOXAZOLE-TRIMETHOPRIM 800-160 MG PO TABS: 1.0000 | ORAL_TABLET | Freq: Two times a day (BID) | ORAL | 0 refills | Status: DC

## 2017-04-30 NOTE — Telephone Encounter (Signed)
Patient called requesting an appointment with Dr. Sabra Heck today. She reported pain and burning after urinating. She said she'd like to come in as soon as possible as she has been up since 4:30 AM with these symptoms.

## 2017-04-30 NOTE — Telephone Encounter (Signed)
Spoke with patient. Patient states that on Saturday 04/27/2017 she began having odor with urination. Sunday 04/28/2017 felt very uncomfortable with urination. Overnight last night she began having constant discomfort that worsens right after urination. Has urinary urgency with little output. Denies any back pain, fever, or chills. Appointment scheduled for 12:30 pm with Dr.Miller (time per Lamont Snowball, RN). Patient is agreeable to date and time.  Routing to provider for final review. Patient agreeable to disposition. Will close encounter.

## 2017-04-30 NOTE — Progress Notes (Signed)
GYNECOLOGY  VISIT  CC:   Dysuria x 3 days  HPI: 47 y.o. G59P2012 Married Caucasian female here for UTI symptoms x 3 days.  Symptoms started on Saturday but acutely changed this morning.  Has not had any sexual partners.  No vaginal bleeding.  No vaginal discharge.  No fever.  No pelvic pain.   GYNECOLOGIC HISTORY: Patient's last menstrual period was 05/20/2012. Contraception: post menopausal  Menopausal hormone therapy: none  Patient Active Problem List   Diagnosis Date Noted  . Cervical polyp 12/22/2014  . Other and unspecified hyperlipidemia 09/12/2012    Past Medical History:  Diagnosis Date  . Hyperlipidemia   . 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 CESAREAN SECTION  1996  . DILATION AND EVACUATION  1998  . UMBILICAL HERNIA REPAIR  2006   with mesh--Dr. Deon Pilling    MEDS:   Current Outpatient Medications on File Prior to Visit  Medication Sig Dispense Refill  . Aspirin-Salicylamide-Caffeine (BC HEADACHE POWDER PO) Take by mouth as needed.    Marland Kitchen ibuprofen (ADVIL,MOTRIN) 200 MG tablet Take 200 mg by mouth every 6 (six) hours as needed.    Marland Kitchen omeprazole (PRILOSEC) 40 MG capsule Take 40 mg by mouth as needed.   1  . Vitamin D, Ergocalciferol, (DRISDOL) 50000 units CAPS capsule Take 1 capsule (50,000 Units total) by mouth every 7 (seven) days. 12 capsule 3   No current facility-administered medications on file prior to visit.     ALLERGIES: Patient has no known allergies.  Family History  Problem Relation Age of Onset  . Diabetes Paternal Grandmother        adult onset  . Stroke Paternal Grandmother        deceased  . Hypertension Mother   . Hypertension Father   . Stroke Paternal Grandfather        deceased  . Heart attack Maternal Uncle        deceased age 48  . Heart attack Maternal Grandmother   . Heart attack Maternal Aunt   . Heart attack Maternal Grandfather   . Heart attack Maternal Uncle    . Heart attack Maternal Aunt     SH:  Married, non smoker  Review of Systems  Genitourinary: Positive for dysuria, frequency and urgency.  All other systems reviewed and are negative.   PHYSICAL EXAMINATION:    BP 112/68 (BP Location: Left Arm, Patient Position: Sitting, Cuff Size: Normal)   Pulse 68   Temp 97.9 F (36.6 C) (Oral)   Resp 14   Wt 118 lb (53.5 kg)   LMP 05/20/2012   BMI 20.41 kg/m     General appearance: alert, cooperative and appears stated age CV:  Regular rate and rhythm Lungs:  clear to auscultation, no wheezes, rales or rhonchi, symmetric air entry Flank:  No CVA tenderness Abdomen: soft, non-tender; bowel sounds normal; no masses,  no organomegaly  Assessment: Cystitis  Plan: Bactrim DS BID x 5 days. Also recommended AZO TID until antibiotics area really improving symptoms Urine culture and micro pending

## 2017-05-01 LAB — URINALYSIS, MICROSCOPIC ONLY
CASTS: NONE SEEN /LPF
EPITHELIAL CELLS (NON RENAL): NONE SEEN /HPF (ref 0–10)
WBC, UA: >30 /hpf — AB (ref 0–?)

## 2017-05-02 LAB — URINE CULTURE

## 2017-06-11 ENCOUNTER — Other Ambulatory Visit: Payer: Self-pay | Admitting: Obstetrics & Gynecology

## 2017-06-11 DIAGNOSIS — Z139 Encounter for screening, unspecified: Secondary | ICD-10-CM

## 2017-06-12 ENCOUNTER — Ambulatory Visit
Admission: RE | Admit: 2017-06-12 | Discharge: 2017-06-12 | Disposition: A | Discharge status: Home / Self Care | Payer: BLUE CROSS/BLUE SHIELD | Source: Ambulatory Visit | Attending: Obstetrics & Gynecology | Admitting: Obstetrics & Gynecology

## 2017-06-12 DIAGNOSIS — Z0000 Encounter for general adult medical examination without abnormal findings: Secondary | ICD-10-CM | POA: Diagnosis not present

## 2017-06-12 DIAGNOSIS — Z139 Encounter for screening, unspecified: Secondary | ICD-10-CM

## 2017-06-12 DIAGNOSIS — Z1231 Encounter for screening mammogram for malignant neoplasm of breast: Secondary | ICD-10-CM | POA: Diagnosis not present

## 2017-06-12 DIAGNOSIS — E7800 Pure hypercholesterolemia, unspecified: Secondary | ICD-10-CM | POA: Diagnosis not present

## 2017-07-05 ENCOUNTER — Ambulatory Visit: Payer: BLUE CROSS/BLUE SHIELD | Admitting: Obstetrics & Gynecology

## 2017-07-17 ENCOUNTER — Other Ambulatory Visit (HOSPITAL_COMMUNITY)
Admission: RE | Admit: 2017-07-17 | Discharge: 2017-07-17 | Disposition: A | Discharge status: Home / Self Care | Payer: BLUE CROSS/BLUE SHIELD | Source: Ambulatory Visit | Attending: Obstetrics & Gynecology | Admitting: Obstetrics & Gynecology

## 2017-07-17 ENCOUNTER — Other Ambulatory Visit: Payer: Self-pay

## 2017-07-17 ENCOUNTER — Ambulatory Visit: Payer: BLUE CROSS/BLUE SHIELD | Admitting: Obstetrics & Gynecology

## 2017-07-17 ENCOUNTER — Encounter: Payer: Self-pay | Admitting: Obstetrics & Gynecology

## 2017-07-17 VITALS — BP 102/70 | HR 84 | Resp 16 | Ht 64.5 in | Wt 118.6 lb

## 2017-07-17 DIAGNOSIS — Z1151 Encounter for screening for human papillomavirus (HPV): Secondary | ICD-10-CM | POA: Diagnosis not present

## 2017-07-17 DIAGNOSIS — E559 Vitamin D deficiency, unspecified: Secondary | ICD-10-CM | POA: Diagnosis not present

## 2017-07-17 DIAGNOSIS — Z1211 Encounter for screening for malignant neoplasm of colon: Secondary | ICD-10-CM

## 2017-07-17 DIAGNOSIS — Z01419 Encounter for gynecological examination (general) (routine) without abnormal findings: Secondary | ICD-10-CM | POA: Diagnosis not present

## 2017-07-17 DIAGNOSIS — Z124 Encounter for screening for malignant neoplasm of cervix: Secondary | ICD-10-CM | POA: Diagnosis not present

## 2017-07-17 MED ORDER — ESTRADIOL 10 MCG VA TABS: ORAL_TABLET | VAGINAL | 2 refills | Status: DC

## 2017-07-17 MED ORDER — VALACYCLOVIR HCL 500 MG PO TABS: 500.0000 mg | ORAL_TABLET | Freq: Every day | ORAL | 1 refills | Status: DC

## 2017-07-17 NOTE — Progress Notes (Signed)
47 y.o. [id number] MarriedCaucasianF here for annual exam.  Reports life has been a little "crazy" the last few months.  One of her daughters moved home after her first job in [place].  She is at [employer].  Youngest is home from [employer].  She just finished freshman year.  This is causing a little stress for pt.    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  She'd had her for 12+ years.    Denies vaginal bleeding.    PCP:  Dr. Nancy Fetter.  Cholesterol was elevated.    Patient's last menstrual period was 05/20/2012.  Sexually active: Yes.    The current method of family planning is post menopausal status.    Exercising: No.  The patient does not participate in regular exercise at present. Smoker:  no  Health Maintenance: Pap:  03/30/14 Neg. HR HPV:neg  03/27/13 Neg  History of abnormal Pap:  no MMG:  06/12/17 BIRADS1:Neg Colonoscopy:  Never BMD:   never TDaP:  2017  Screening Labs: PCP   reports that she has never smoked. She has never used smokeless tobacco. She reports that she does not drink alcohol or use drugs.  Past Medical History:  Diagnosis Date  . Hyperlipidemia   . 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 CESAREAN SECTION  1996  . DILATION AND EVACUATION  1998  . UMBILICAL HERNIA REPAIR  2006   with mesh--Dr. Deon Pilling    Current Outpatient Medications  Medication Sig Dispense Refill  . Aspirin-Salicylamide-Caffeine (BC HEADACHE POWDER PO) Take by mouth as needed.    Marland Kitchen ibuprofen (ADVIL,MOTRIN) 200 MG tablet Take 200 mg by mouth every 6 (six) hours as needed.    Marland Kitchen omeprazole (PRILOSEC) 40 MG capsule Take 40 mg by mouth as needed.   1  . Vitamin D, Ergocalciferol, (DRISDOL) 50000 units CAPS capsule Take 1 capsule (50,000 Units total) by mouth every 7 (seven) days. 12 capsule 3   No current facility-administered medications for this visit.     Family History  Problem  Relation Age of Onset  . Diabetes Paternal Grandmother        adult onset  . Stroke Paternal Grandmother        deceased  . Hypertension Mother   . Hypertension Father   . Stroke Paternal Grandfather        deceased  . Heart attack Maternal Uncle        deceased age 101  . Heart attack Maternal Grandmother   . Heart attack Maternal Aunt   . Heart attack Maternal Grandfather   . Heart attack Maternal Uncle   . Heart attack Maternal Aunt     Review of Systems  Genitourinary:       Pain with intercourse Loss of sexual interest   Endo/Heme/Allergies:       Craving sweets   All other systems reviewed and are negative.   Exam:   BP 102/70 (BP Location: Right Arm, Patient Position: Sitting, Cuff Size: Normal)   Pulse 84   Resp 16   Ht 5' 4.5" (1.638 m)   Wt 118 lb 9.6 oz (53.8 kg)   LMP 05/20/2012   BMI 20.04 kg/m   Height: 5' 4.5" (163.8 cm)  Ht Readings from Last 3 Encounters:  07/17/17 5' 4.5" (1.638 m)  06/15/16 5' 3.75" (1.619 m)  07/12/15 5'  4" (1.626 m)    General appearance: alert, cooperative and appears stated age Head: Normocephalic, without obvious abnormality, atraumatic Neck: no adenopathy, supple, symmetrical, trachea midline and thyroid normal to inspection and palpation Lungs: clear to auscultation bilaterally Breasts: normal appearance, no masses or tenderness Heart: regular rate and rhythm Abdomen: soft, non-tender; bowel sounds normal; no masses,  no organomegaly Extremities: extremities normal, atraumatic, no cyanosis or edema Skin: Skin color, texture, turgor normal. No rashes or lesions Lymph nodes: Cervical, supraclavicular, and axillary nodes normal. No abnormal inguinal nodes palpated Neurologic: Grossly normal   Pelvic: External genitalia:  no lesions              Urethra:  normal appearing urethra with no masses, tenderness or lesions              Bartholins and Skenes: normal                 Vagina: normal appearing vagina with normal  color and discharge, no lesions              Cervix: no lesions, nabothian cyst noted about 84mm coming off of posterior aspect of cervix              Pap taken: Yes.   Bimanual Exam:  Uterus:  normal size, contour, position, consistency, mobility, non-tender              Adnexa: normal adnexa and no mass, fullness, tenderness               Rectovaginal: Confirms               Anus:  normal sphincter tone, no lesions  Chaperone was present for exam.  A:  Well Woman with normal exam PMP, no HRT H/O colon polyps Dens breast tissue Family hx of osteoporosis (mother) Vaginal atrophic changes HSV 2  P:   Mammogram guidelines reviewed.   pap smear and HR HPV obtained today Vit D obtained.  RF for Vit D 50K weekly.   Trial of vagifem 22meq pv nightly x 14 days, then twice weekly Did try HRT for fatigue and decreased libido symptoms.  Decided to stop. RF for Valtrex 500mg  prn outbreaks Return annually or prn

## 2017-07-18 DIAGNOSIS — Z1211 Encounter for screening for malignant neoplasm of colon: Secondary | ICD-10-CM | POA: Diagnosis not present

## 2017-07-18 LAB — VITAMIN D 25 HYDROXY (VIT D DEFICIENCY, FRACTURES): VIT D 25 HYDROXY: 36.5 ng/mL (ref 30.0–100.0)

## 2017-07-19 LAB — CYTOLOGY - PAP
Diagnosis: NEGATIVE
HPV: NOT DETECTED

## 2017-07-24 LAB — FECAL OCCULT BLOOD, IMMUNOCHEMICAL: Fecal Occult Bld: NEGATIVE

## 2017-08-14 DIAGNOSIS — R233 Spontaneous ecchymoses: Secondary | ICD-10-CM | POA: Diagnosis not present

## 2017-11-20 ENCOUNTER — Other Ambulatory Visit: Payer: Self-pay | Admitting: Obstetrics & Gynecology

## 2017-12-22 ENCOUNTER — Other Ambulatory Visit: Payer: Self-pay | Admitting: Obstetrics & Gynecology

## 2017-12-23 NOTE — Telephone Encounter (Signed)
Medication refill request: Yuvafem  Last AEX:  07/17/17 SM Next AEX: 10/17/18 Last MMG (if hormonal medication request): 06/12/17 BIRADS1:Neg  Refill authorized: 07/17/17 #[id number]/[facility]R [facility] [place]

## 2017-12-30 ENCOUNTER — Other Ambulatory Visit: Payer: Self-pay | Admitting: Obstetrics & Gynecology

## 2017-12-30 NOTE — Telephone Encounter (Signed)
Medication refill request:  Yuvafem   53mcg  Last AEX:  07/17/17 Next AEX: 10/17/18 Last MMG (if hormonal medication request): Bi-rads 1 neg    Refill authorized: #18 with 2RF

## 2018-02-18 ENCOUNTER — Encounter

## 2018-03-10 ENCOUNTER — Telehealth: Payer: Self-pay | Admitting: Obstetrics & Gynecology

## 2018-03-10 NOTE — Telephone Encounter (Signed)
Patient called requesting an appointment a possible bacterial or yeast infection. Routing to triage for further assessment and scheduling.

## 2018-03-10 NOTE — Telephone Encounter (Signed)
Spoke with patient. Patient reports itching just inside vaginal canal, feels swollen and uncomfortable. Symptoms started last week after having intercourse with no lubricant. Tried OTC 1 day monistat on 1/18, symptoms not resolved. Uses yuvafem twice weekly, does not feel this has been helping, still experiencing pain with intercourse, unsure if related to dryness. Denies vaginal odor, d/c or bleeding.   OV scheduled for 1/21 at 12:45pm with Dr. Sabra Heck.   Routing to provider for final review. Patient is agreeable to disposition. Will close encounter.

## 2018-03-11 ENCOUNTER — Ambulatory Visit (INDEPENDENT_AMBULATORY_CARE_PROVIDER_SITE_OTHER): Payer: BLUE CROSS/BLUE SHIELD | Admitting: Obstetrics & Gynecology

## 2018-03-11 ENCOUNTER — Encounter: Payer: Self-pay | Admitting: Obstetrics & Gynecology

## 2018-03-11 ENCOUNTER — Other Ambulatory Visit: Payer: Self-pay

## 2018-03-11 VITALS — BP 110/70 | HR 68 | Resp 16 | Ht 64.5 in | Wt 111.0 lb

## 2018-03-11 DIAGNOSIS — N941 Unspecified dyspareunia: Secondary | ICD-10-CM | POA: Diagnosis not present

## 2018-03-11 DIAGNOSIS — N951 Menopausal and female climacteric states: Secondary | ICD-10-CM | POA: Diagnosis not present

## 2018-03-11 DIAGNOSIS — N898 Other specified noninflammatory disorders of vagina: Secondary | ICD-10-CM

## 2018-03-11 MED ORDER — ESTRADIOL-NORETHINDRONE ACET 0.05-0.25 MG/DAY TD PTTW
1.0000 | MEDICATED_PATCH | TRANSDERMAL | 3 refills | Status: DC
Start: 2018-03-13 — End: 2018-06-17

## 2018-03-11 MED ORDER — FLUCONAZOLE 150 MG PO TABS: 150.0000 mg | ORAL_TABLET | Freq: Every day | ORAL | 0 refills | Status: DC

## 2018-03-11 NOTE — Progress Notes (Signed)
GYNECOLOGY  VISIT  CC:   Vaginal discharge   HPI: 48 y.o. G15P2012 Married Other or two or more races female here for pain with intercourse.  Feels this started after intercourse on January 11th.  She was a little irritated the day after and then she felt some discharge start.  She is not completely sure these are related.  The discharge is greyish and clumpy and, at times, very watery.  She used the OTC monistat one day treatment on 1/18 and symptoms got a little better.  She does not feel "right" yet so decided to be seen.  Denies pelvic pain or urinary symptoms.  Denies vaginal bleeding.  Continues to feel "old" given her age.  Does not like how she has felt since menopause.  Felts the best when she was on Combipatch.  Continues to have pain with intercourse.  Would like that part of her marriage to improve.  Doesn't like her skin or hair changes.  Stopped the Combipatch because of bleeding.  Continues to consider this as an option.    Discussed with her today HRT options.  As she is a little further into menopause, risk of bleeding with HRT is decreased.  Discussed starting back Combipatch for a month or two to see how she feels.  Could then transition to vivelle dot and cyclic progesterone if has bleeding.  Risks reviewed.  She would like to have Rx to consider this again.    GYNECOLOGIC HISTORY: Patient's last menstrual period was 05/20/2012. Contraception: PMP Menopausal hormone therapy: none  Patient Active Problem List   Diagnosis Date Noted  . Cervical polyp 12/22/2014  . Other and unspecified hyperlipidemia 09/12/2012    Past Medical History:  Diagnosis Date  . Hyperlipidemia   . 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 CESAREAN SECTION  1996  . DILATION AND EVACUATION  1998  . UMBILICAL HERNIA REPAIR  2006   with mesh--Dr. Deon Pilling    MEDS:   Current Outpatient Medications on File Prior to Visit  Medication  Sig Dispense Refill  . Aspirin-Salicylamide-Caffeine (BC HEADACHE POWDER PO) Take by mouth as needed.    Marland Kitchen ibuprofen (ADVIL,MOTRIN) 200 MG tablet Take 200 mg by mouth every 6 (six) hours as needed.    Marland Kitchen omeprazole (PRILOSEC) 40 MG capsule Take 40 mg by mouth as needed.   1  . valACYclovir (VALTREX) 500 MG tablet Take 1 tablet (500 mg total) by mouth daily. Take as directed. 30 tablet 1  . YUVAFEM 10 MCG TABS vaginal tablet 1 TAB PV NIGHTLY X 2 WEEKS, THEN TWICE WEEKLY 24 tablet 4   No current facility-administered medications on file prior to visit.     ALLERGIES: Patient has no known allergies.  Family History  Problem Relation Age of Onset  . Diabetes Paternal Grandmother        adult onset  . Stroke Paternal Grandmother        deceased  . Hypertension Mother   . Hypertension Father   . Stroke Paternal Grandfather        deceased  . Heart attack Maternal Uncle        deceased age 30  . Heart attack Maternal Grandmother   . Heart attack Maternal Aunt   . Heart attack Maternal Grandfather   . Heart attack Maternal Uncle   . Heart attack Maternal Aunt     SH:  Married, non smoker  Review of Systems  Genitourinary: Positive for dyspareunia and vaginal discharge.       Vaginal itching  All other systems reviewed and are negative.   PHYSICAL EXAMINATION:    BP 110/70 (BP Location: Right Arm, Patient Position: Sitting, Cuff Size: Normal)   Pulse 68   Resp 16   Ht 5' 4.5" (1.638 m)   Wt 111 lb (50.3 kg)   LMP 05/20/2012   BMI 18.76 kg/m     General appearance: alert, cooperative and appears stated age Abdomen: soft, non-tender; bowel sounds normal; no masses,  no organomegaly Lymph:  no inguinal LAD noted  Pelvic: External genitalia:  no lesions              Urethra:  normal appearing urethra with no masses, tenderness or lesions              Bartholins and Skenes: normal                 Vagina: normal appearing vagina with normal color and discharge, no lesions               Cervix: no lesions              Bimanual Exam:  Uterus:  normal size, contour, position, consistency, mobility, non-tender              Adnexa: no mass, fullness, tenderness  Chaperone was present for exam.  Assessment: Vaginal discharge Menopausal symptoms that are very dissatisfying to her  Plan: Vaginitis swab obtained today Diflucan 150mg  po x 1, repeat in 72 hours if needed.  #2/0RF. Restart combipatch 50/250, one patch to skin twice weekly.  May need to plan follow up after vaginitis test results are back.   ~30 minutes spent with patient >50% of time was in face to face discussion of above.

## 2018-03-12 LAB — VAGINITIS/VAGINOSIS, DNA PROBE
CANDIDA SPECIES: POSITIVE — AB
Gardnerella vaginalis: NEGATIVE
Trichomonas vaginosis: NEGATIVE

## 2018-04-03 DIAGNOSIS — D225 Melanocytic nevi of trunk: Secondary | ICD-10-CM | POA: Diagnosis not present

## 2018-04-03 DIAGNOSIS — Z411 Encounter for cosmetic surgery: Secondary | ICD-10-CM | POA: Diagnosis not present

## 2018-04-03 DIAGNOSIS — L821 Other seborrheic keratosis: Secondary | ICD-10-CM | POA: Diagnosis not present

## 2018-04-03 DIAGNOSIS — L820 Inflamed seborrheic keratosis: Secondary | ICD-10-CM | POA: Diagnosis not present

## 2018-05-14 ENCOUNTER — Telehealth: Payer: Self-pay | Admitting: Obstetrics & Gynecology

## 2018-05-14 NOTE — Telephone Encounter (Signed)
Spoke with patient. Message given as seen below from [place]. Denies any bleeding prior to starting the patch. Patient verbalizes understanding and will call with increased bleeding or bleeding past 6 months. Encounter closed.

## 2018-05-14 NOTE — Telephone Encounter (Signed)
Spoke with patient. Patient states that she is on the Combipatch and has been experiencing spotting. First episode was 3/5-3/10. Began spotting again on 3/23. Is only having to wear a panty liner. Denies any heavy bleeding or pain. States this occurred many years ago when she used the patch so she stopped using it. Asking if she should continue using the patch as directed? States she has not missed any days and has been changing patch as directed.

## 2018-05-14 NOTE — Telephone Encounter (Signed)
Patient stated that she is currently using estradiol-norethindrone patch and experiencing spotting. Patient stated that the spotting started 05/12/18. Patient stated that she was experiencing spotting 2-3 weeks ago for about a week.

## 2018-05-14 NOTE — Telephone Encounter (Signed)
Sometimes women will have spotting in the first 6 months after starting combination HRT. If she had any spotting prior to starting the patch she needs to be seen. If she has continued spotting after the first 6  months or heavier bleeding prior to that she needs to see Dr Sabra Heck.  I will cc this to Dr Sabra Heck (she knows the patient, if she feels differently we will be back in touch with the patient).

## 2018-06-03 ENCOUNTER — Other Ambulatory Visit: Payer: Self-pay | Admitting: Obstetrics & Gynecology

## 2018-06-03 NOTE — Telephone Encounter (Signed)
Medication refill request: valtrex 500mg  Last AEX:  07-17-2017 Next AEX: 10-17-2018 Last MMG (if hormonal medication request): n/a Refill authorized: last refilled 07-17-17 #30 with 1 refill. Please approve if appropriate.

## 2018-06-06 ENCOUNTER — Telehealth: Payer: Self-pay | Admitting: Obstetrics & Gynecology

## 2018-06-06 NOTE — Telephone Encounter (Signed)
Spoke with patient. Patient was scheduled for 3 month follow up on HRT with Dr.Miller 06/13/2018. This was cancelled due to [place] being out of the office. Patient prefers to only see Dr.Miller. Denies any current problems or concerns. Appointment scheduled for 06/19/2018 at 9 am with Dr.Miller. Patient is aware that Dr.Miller may not be back at this time and appointment may need to be adjusted. Patient is agreeable.  Routing to provider and will close encounter.

## 2018-06-06 NOTE — Telephone Encounter (Signed)
Patient's appointment for follow up was canceled due to Crystal Macdonald being out of the office. She was told she would be able to see a different physician if she wanted.

## 2018-06-13 ENCOUNTER — Ambulatory Visit: Payer: BLUE CROSS/BLUE SHIELD | Admitting: Obstetrics & Gynecology

## 2018-06-16 ENCOUNTER — Other Ambulatory Visit: Payer: Self-pay

## 2018-06-17 ENCOUNTER — Encounter: Payer: Self-pay | Admitting: Obstetrics and Gynecology

## 2018-06-17 ENCOUNTER — Ambulatory Visit: Payer: BLUE CROSS/BLUE SHIELD | Admitting: Obstetrics and Gynecology

## 2018-06-17 VITALS — BP 100/60 | HR 64 | Temp 97.8°F | Ht 64.5 in | Wt 115.0 lb

## 2018-06-17 DIAGNOSIS — Z79890 Hormone replacement therapy (postmenopausal): Secondary | ICD-10-CM

## 2018-06-17 MED ORDER — PROGESTERONE MICRONIZED 100 MG PO CAPS
100.0000 mg | ORAL_CAPSULE | Freq: Every day | ORAL | 1 refills | Status: DC
Start: 2018-06-17 — End: 2018-08-19

## 2018-06-17 MED ORDER — ESTRADIOL 0.05 MG/24HR TD PTTW: 1.0000 | MEDICATED_PATCH | TRANSDERMAL | 1 refills | Status: DC

## 2018-06-17 NOTE — Progress Notes (Signed)
GYNECOLOGY  VISIT   HPI: 48 y.o.   Married  Caucasian  female   [phone] with Patient's last menstrual period was 05/20/2012.   here for HRT follow up.    Restarted Combipatch in January, 2020.  She used this in the past and had vaginal bleeding, so she stopped it.  Now she is reporting improvement in intercourse with Combipatch versus Vagifem. She does not really have hot flashes.  She is sleeping better overall but not since her husband is home more often these days.  She had some spotting initially with use of the Combipatch, but this resolved.  She has noted redness of her skin with this patch use.  This is disturbing to her.  Some breast tenderness bilaterally.  She is also concerned about bone protection, so she likes the HRT for this reason.   States she has elevated cholesterol and that she has chosen not to take medication.  Strong FH of CAD.   GYNECOLOGIC HISTORY: Patient's last menstrual period was 05/20/2012. Contraception:  PMP Menopausal hormone therapy:  none Last mammogram:  06/12/17 BIRADS1:Neg Last pap smear:   07/17/17 Neg. HR HPV:neg        OB History    Gravida  3   Para  2   Term  2   Preterm      AB  1   Living  2     SAB  1   TAB      Ectopic      Multiple      Live Births  2              Patient Active Problem List   Diagnosis Date Noted  . Cervical polyp 12/22/2014  . Other and unspecified hyperlipidemia 09/12/2012    Past Medical History:  Diagnosis Date  . Hyperlipidemia   . 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 CESAREAN SECTION  1996  . DILATION AND EVACUATION  1998  . UMBILICAL HERNIA REPAIR  2006   with mesh--Dr. Deon Pilling    Current Outpatient Medications  Medication Sig Dispense Refill  . Aspirin-Salicylamide-Caffeine (BC HEADACHE POWDER PO) Take by mouth as needed.    Marland Kitchen estradiol-norethindrone (COMBIPATCH) 0.05-0.25 MG/DAY Place 1 patch onto  the skin 2 (two) times a week. 8 patch 3  . ibuprofen (ADVIL,MOTRIN) 200 MG tablet Take 200 mg by mouth every 6 (six) hours as needed.    . valACYclovir (VALTREX) 500 MG tablet TAKE 1 TABLET (500 MG TOTAL) BY MOUTH DAILY. TAKE AS DIRECTED. 30 tablet 1   No current facility-administered medications for this visit.      ALLERGIES: Patient has no known allergies.  Family History  Problem Relation Age of Onset  . Diabetes Paternal Grandmother        adult onset  . Stroke Paternal Grandmother        deceased  . Hypertension Mother   . Hypertension Father   . Stroke Paternal Grandfather        deceased  . Heart attack Maternal Uncle        deceased age 27  . Heart attack Maternal Grandmother   . Heart attack Maternal Aunt   . Heart attack Maternal Grandfather   . Heart attack Maternal Uncle   . Heart attack Maternal Aunt     Social History   Socioeconomic History  . Marital status: Married    Spouse name:  Not on file  . Number of children: Not on file  . Years of education: Not on file  . Highest education level: Not on file  Occupational History  . Not on file  Social Needs  . Financial resource strain: Not on file  . Food insecurity:    Worry: Not on file    Inability: Not on file  . Transportation needs:    Medical: Not on file    Non-medical: Not on file  Tobacco Use  . Smoking status: Never Smoker  . Smokeless tobacco: Never Used  Substance and Sexual Activity  . Alcohol use: No    Alcohol/week: 0.0 standard drinks  . Drug use: No  . Sexual activity: Yes    Partners: Male    Birth control/protection: Post-menopausal  Lifestyle  . Physical activity:    Days per week: Not on file    Minutes per session: Not on file  . Stress: Not on file  Relationships  . Social connections:    Talks on phone: Not on file    Gets together: Not on file    Attends religious service: Not on file    Active member of club or organization: Not on file    Attends meetings of  clubs or organizations: Not on file    Relationship status: Not on file  . Intimate partner violence:    Fear of current or ex partner: Not on file    Emotionally abused: Not on file    Physically abused: Not on file    Forced sexual activity: Not on file  Other Topics Concern  . Not on file  Social History Narrative  . Not on file    Review of Systems  Genitourinary: Positive for vaginal bleeding.  All other systems reviewed and are negative.   PHYSICAL EXAMINATION:    BP 100/60   Pulse 64   Temp 97.8 F (36.6 C) (Oral)   Ht 5' 4.5" (1.638 m)   Wt 115 lb (52.2 kg)   LMP 05/20/2012   BMI 19.43 kg/m     General appearance: alert, cooperative and appears stated age   Skin: abdomen with redness from patch site.    ASSESSMENT  HRT Hyperlipidemia.  FH CAD.  PLAN  Stop Combipatch.  Start Minivelle 0.05 mg twice weekly.  #24, RF 1.  Prometrium 100 mg daily at hs.  #90, RF 1. Discused WHI and use of HRT which can increase risk of PE, DVT, MI, stroke and breast cancer.  She will reach out to her PCP to have a referral to cardiology for her hyperlipidemia and FH CAD.  She would like to see someone in [place].  She will update her mammogram when possible, due to Covid 19 restrictions.  Questions invited and answered.  FU with Dr. Sabra Heck for annual exam and prn.    An After Visit Summary was printed and given to the patient.  _25_____ minutes face to face time of which over 50% was spent in counseling.

## 2018-06-19 ENCOUNTER — Ambulatory Visit: Payer: BLUE CROSS/BLUE SHIELD | Admitting: Obstetrics & Gynecology

## 2018-07-16 ENCOUNTER — Other Ambulatory Visit: Payer: Self-pay | Admitting: Obstetrics & Gynecology

## 2018-07-16 DIAGNOSIS — Z1231 Encounter for screening mammogram for malignant neoplasm of breast: Secondary | ICD-10-CM

## 2018-07-23 ENCOUNTER — Telehealth: Payer: Self-pay | Admitting: Obstetrics & Gynecology

## 2018-07-23 NOTE — Telephone Encounter (Signed)
Spoke with patient. Patient reports vaginal itching, discomfort, burning, "faint odor", and watery discharge with clumps. Symptoms started 5/29. No partner changes, new products or STD concerns. Has not tried anything OTC, prefers OV. Denies vaginal bleeding, fever/chills or pain. OV scheduled for 6/5 at 10am with Dr. Sabra Heck. [id number] precautions reviewed.   Routing to provider for final review. Patient is agreeable to disposition. Will close encounter.

## 2018-07-23 NOTE — Telephone Encounter (Signed)
Patient returning call.

## 2018-07-23 NOTE — Telephone Encounter (Signed)
Left message to call Theordore Cisnero, RN at [facility] [phone].   

## 2018-07-23 NOTE — Telephone Encounter (Signed)
Left message to call Kaytee Taliercio, RN at [facility] [phone].   

## 2018-07-23 NOTE — Telephone Encounter (Signed)
Patient is calling regarding a possible yeast or bacterial infection. Patient stated that she is having itching, burning and discharge.

## 2018-07-24 ENCOUNTER — Other Ambulatory Visit: Payer: Self-pay

## 2018-07-25 ENCOUNTER — Ambulatory Visit: Payer: BC Managed Care – PPO | Admitting: Obstetrics & Gynecology

## 2018-07-25 ENCOUNTER — Encounter: Payer: Self-pay | Admitting: Obstetrics & Gynecology

## 2018-07-25 VITALS — BP 110/80 | HR 64 | Temp 97.8°F | Ht 64.5 in | Wt 112.0 lb

## 2018-07-25 DIAGNOSIS — N760 Acute vaginitis: Secondary | ICD-10-CM | POA: Diagnosis not present

## 2018-07-25 MED ORDER — FLUCONAZOLE 150 MG PO TABS: ORAL_TABLET | ORAL | 0 refills | Status: DC

## 2018-07-25 NOTE — Progress Notes (Signed)
GYNECOLOGY  VISIT  CC:   Vaginitis symptoms   HPI: 48 y.o. G36P2012 Married  female here for burning, itching, discharge x 1 week.  Symptoms have improved the last day or two.  Denies vaginal bleeding.  Using minivelle 0.05mg  patches twice weekly.  Denies urinary symptoms or pelvic pain.  Denies back pain.    Had some breast fullness with combipatch.  Switched to vivelle dot and Prometrium.  Is considering going back to the combipatch.    GYNECOLOGIC HISTORY: Patient's last menstrual period was 05/20/2012. Contraception: PMP Menopausal hormone therapy: minivelle patch, progesterone 100mg   Patient Active Problem List   Diagnosis Date Noted  . Cervical polyp 12/22/2014  . Other and unspecified hyperlipidemia 09/12/2012    Past Medical History:  Diagnosis Date  . Hyperlipidemia   . 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 CESAREAN SECTION  1996  . DILATION AND EVACUATION  1998  . UMBILICAL HERNIA REPAIR  2006   with mesh--Dr. Deon Pilling    MEDS:   Current Outpatient Medications on File Prior to Visit  Medication Sig Dispense Refill  . Aspirin-Salicylamide-Caffeine (BC HEADACHE POWDER PO) Take by mouth as needed.    Marland Kitchen estradiol (MINIVELLE) 0.05 MG/24HR patch Place 1 patch (0.05 mg total) onto the skin 2 (two) times a week. 24 patch 1  . ibuprofen (ADVIL,MOTRIN) 200 MG tablet Take 200 mg by mouth every 6 (six) hours as needed.    . progesterone (PROMETRIUM) 100 MG capsule Take 1 capsule (100 mg total) by mouth daily. 90 capsule 1  . valACYclovir (VALTREX) 500 MG tablet TAKE 1 TABLET (500 MG TOTAL) BY MOUTH DAILY. TAKE AS DIRECTED. 30 tablet 1   No current facility-administered medications on file prior to visit.     ALLERGIES: Patient has no known allergies.  Family History  Problem Relation Age of Onset  . Diabetes Paternal Grandmother        adult onset  . Stroke Paternal Grandmother        deceased  .  Hypertension Mother   . Hypertension Father   . Stroke Paternal Grandfather        deceased  . Heart attack Maternal Uncle        deceased age 36  . Heart attack Maternal Grandmother   . Heart attack Maternal Aunt   . Heart attack Maternal Grandfather   . Heart attack Maternal Uncle   . Heart attack Maternal Aunt     SH:  Married, non smoker  Review of Systems  Genitourinary: Positive for vaginal discharge.       Itching  Vaginal irritation   All other systems reviewed and are negative.   PHYSICAL EXAMINATION:    BP 110/80   Pulse 64   Temp 97.8 F (36.6 C) (Temporal)   Ht 5' 4.5" (1.638 m)   Wt 112 lb (50.8 kg)   LMP 05/20/2012   BMI 18.93 kg/m     General appearance: alert, cooperative and appears stated age Abdomen: soft, non-tender; bowel sounds normal; no masses,  no organomegaly Lymph:  no inguinal LAD noted  Pelvic: External genitalia:  no lesions              Urethra:  normal appearing urethra with no masses, tenderness or lesions              Bartholins and Skenes: normal  Vagina: normal appearing vagina with normal color, yellowish discharge present, no lesions present              Cervix: no lesions              Bimanual Exam:  Uterus:  normal size, contour, position, consistency, mobility, non-tender              Adnexa: no mass, fullness, tenderness  Chaperone was present for exam.  Assessment: Vaginal discharge  Plan: Affirm pending Diflucan 150mg  po x 1, repeat 72 hours.  #2/0RF

## 2018-07-26 LAB — VAGINITIS/VAGINOSIS, DNA PROBE
Candida Species: POSITIVE — AB
Gardnerella vaginalis: NEGATIVE
Trichomonas vaginosis: NEGATIVE

## 2018-08-06 ENCOUNTER — Telehealth: Payer: Self-pay | Admitting: *Deleted

## 2018-08-06 NOTE — Telephone Encounter (Signed)
Spoke with patient. Patient is on estradiol 0.05 mg patch twice wkly and prometrium 100 mg daily. Changed from [place], reports redness at site of patch. 2 weeks after starting estradiol patch and progesterone has noticed random, small red splotches. She may have one on leg, arm, stomach or back. They may stay raised for 1-2 hrs then resolve. No itching or warmth. No redness at site of patch. Denies wheezing, SHOB or difficulty breathing. Denies any other symptoms, patch working well otherwise.   Has 2 wks left of her RX, discussed changing back to [place] when on office on 07/25/18. Asking if "spots" are related to current patch? OK to complete current RX and then change back to Combipatch?   Advised Dr. Sabra Heck is out of the office today, will review on 6/18 and return call with recommendations. Advised if symptoms worsen, develop difficulty breathing or SHOB, seek immediate care at ER. Patient agreeable.   Dr. Sabra Heck -please advise.

## 2018-08-06 NOTE — Telephone Encounter (Signed)
Patient would like to speak with nurse about a medication that she may be having an allergic reaction to.

## 2018-08-08 NOTE — Telephone Encounter (Signed)
Spoke with patient. Advised of Dr.Miller's recommendations below. Patient would like to think about it. She states she will call back first of week and let us know her plan.

## 2018-08-08 NOTE — Telephone Encounter (Signed)
Honeslty, I am not sure if it is related because it started two weeks after making a change.  The only way to know is change medication again.  If she wants to do this, could switch to oral Activella HS (half strength) and take one tab daily for one month to see if symptoms resolve.  Ok to switch back to [employer] as well.  Thanks.

## 2018-08-11 ENCOUNTER — Ambulatory Visit
Admission: RE | Admit: 2018-08-11 | Discharge: 2018-08-11 | Disposition: A | Discharge status: Home / Self Care | Payer: Self-pay | Source: Ambulatory Visit | Attending: Obstetrics & Gynecology | Admitting: Obstetrics & Gynecology

## 2018-08-11 ENCOUNTER — Other Ambulatory Visit: Payer: Self-pay

## 2018-08-11 DIAGNOSIS — Z1231 Encounter for screening mammogram for malignant neoplasm of breast: Secondary | ICD-10-CM

## 2018-08-12 DIAGNOSIS — L309 Dermatitis, unspecified: Secondary | ICD-10-CM | POA: Diagnosis not present

## 2018-08-19 MED ORDER — COMBIPATCH 0.05-0.25 MG/DAY TD PTTW: 1.0000 | MEDICATED_PATCH | TRANSDERMAL | 1 refills | Status: DC

## 2018-08-19 NOTE — Telephone Encounter (Signed)
Patient would like to change her prescription "back to CombiPatch". [facility] [place].

## 2018-08-19 NOTE — Telephone Encounter (Signed)
Spoke with patient. Rx d/c for estradiol patch and Prometrium. New Rx sent for combipatch 0.05-0.25mg  patch twice wkly #8/1RF to verified pharmacy. Patient request as 30 day supply. Patient will see Dr. Sabra Heck for AEX 10/17/18 and provide update. Patient verbalizes understanding and is agreeable.   Routing to provider for final review. Patient is agreeable to disposition. Will close encounter.

## 2018-08-29 ENCOUNTER — Ambulatory Visit: Payer: BLUE CROSS/BLUE SHIELD

## 2018-10-12 ENCOUNTER — Other Ambulatory Visit: Payer: Self-pay | Admitting: Obstetrics & Gynecology

## 2018-10-13 NOTE — Telephone Encounter (Signed)
Medication refill request: Combipatch Last AEX:  07/17/17 SM Next AEX: 10/17/18 Last MMG (if hormonal medication request): 08/11/18 BIRADS 1 negative/density c Refill authorized: Please advise; patient has appointment for AEX 10/17/18

## 2018-10-16 ENCOUNTER — Other Ambulatory Visit: Payer: Self-pay

## 2018-10-17 ENCOUNTER — Encounter: Payer: Self-pay | Admitting: Obstetrics & Gynecology

## 2018-10-17 ENCOUNTER — Ambulatory Visit (INDEPENDENT_AMBULATORY_CARE_PROVIDER_SITE_OTHER): Payer: BC Managed Care – PPO | Admitting: Obstetrics & Gynecology

## 2018-10-17 VITALS — BP 108/80 | HR 80 | Temp 98.0°F | Ht 64.0 in | Wt 108.0 lb

## 2018-10-17 DIAGNOSIS — Z01419 Encounter for gynecological examination (general) (routine) without abnormal findings: Secondary | ICD-10-CM | POA: Diagnosis not present

## 2018-10-17 DIAGNOSIS — Z0000 Encounter for general adult medical examination without abnormal findings: Secondary | ICD-10-CM | POA: Diagnosis not present

## 2018-10-17 MED ORDER — COMBIPATCH 0.05-0.25 MG/DAY TD PTTW: 1.0000 | MEDICATED_PATCH | TRANSDERMAL | 4 refills | Status: DC

## 2018-10-17 NOTE — Progress Notes (Signed)
48 y.o. [id number] Married Other or two or more races female here for annual exam.  Having skin reaction to patch.  Feels better on this than any thing else she's tried.  Vaginal dryness is much improved.  Mood is better.  Husband lost his job in early June at [employer].  He is starting a new job on Monday.  Daughter at [employer] in on campus appt.    Denies vaginal bleeding.    Patient's last menstrual period was 05/20/2012.          Sexually active: Yes.    The current method of family planning is post menopausal status.    Exercising: No.   Smoker:  no  Health Maintenance: Pap:  07/17/17 Neg. HR HPV:neg   03/30/14 Neg. HR HPV:neg  History of abnormal Pap:  no MMG:  08/11/18 BIRADS1:neg Colonoscopy:  Never.  Reviewed guidelines.  Cologuard discussed.  She is willing to do this. BMD:   Never TDaP:  2017 Pneumonia vaccine(s):  n/a Shingrix:   D/w pt today.   Hep C testing: n/a Screening Labs: Here today    reports that she has never smoked. She has never used smokeless tobacco. She reports that she does not drink alcohol or use drugs.  Past Medical History:  Diagnosis Date  . Hyperlipidemia   . 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 CESAREAN SECTION  1996  . DILATION AND EVACUATION  1998  . UMBILICAL HERNIA REPAIR  2006   with mesh--Dr. Deon Pilling    Current Outpatient Medications  Medication Sig Dispense Refill  . Aspirin-Salicylamide-Caffeine (BC HEADACHE POWDER PO) Take by mouth as needed.    . COMBIPATCH 0.05-0.25 MG/DAY PLACE 1 PATCH ONTO THE SKIN 2 (TWO) TIMES A WEEK. 8 patch 1  . doxylamine, Sleep, (SLEEP-AID) 25 MG tablet Take 25 mg by mouth at bedtime as needed.    Marland Kitchen ibuprofen (ADVIL,MOTRIN) 200 MG tablet Take 200 mg by mouth every 6 (six) hours as needed.    . valACYclovir (VALTREX) 500 MG tablet TAKE 1 TABLET (500 MG TOTAL) BY MOUTH DAILY. TAKE AS DIRECTED. 30 tablet 1   No current facility-administered  medications for this visit.     Family History  Problem Relation Age of Onset  . Diabetes Paternal Grandmother        adult onset  . Stroke Paternal Grandmother        deceased  . Hypertension Mother   . Hypertension Father   . Stroke Paternal Grandfather        deceased  . Heart attack Maternal Uncle        deceased age 15  . Heart attack Maternal Grandmother   . Heart attack Maternal Aunt   . Heart attack Maternal Grandfather   . Heart attack Maternal Uncle   . Heart attack Maternal Aunt   . Breast cancer Neg Hx     Review of Systems  All other systems reviewed and are negative.   Exam:   BP 108/80   Pulse 80   Temp 98 F (36.7 C) (Temporal)   Ht 5\' 4"  (1.626 m)   Wt 108 lb (49 kg)   LMP 05/20/2012   BMI 18.54 kg/m   Height: 5\' 4"  (162.6 cm)  Ht Readings from Last 3 Encounters:  10/17/18 5\' 4"  (1.626 m)  07/25/18 5' 4.5" (1.638 m)  06/17/18 5' 4.5" (1.638 m)    General  appearance: alert, cooperative and appears stated age Head: Normocephalic, without obvious abnormality, atraumatic Neck: no adenopathy, supple, symmetrical, trachea midline and thyroid normal to inspection and palpation Lungs: clear to auscultation bilaterally Breasts: normal appearance, no masses or tenderness Heart: regular rate and rhythm Abdomen: soft, non-tender; bowel sounds normal; no masses,  no organomegaly Extremities: extremities normal, atraumatic, no cyanosis or edema Skin: Skin color, texture, turgor normal. No rashes or lesions Lymph nodes: Cervical, supraclavicular, and axillary nodes normal. No abnormal inguinal nodes palpated Neurologic: Grossly normal  Pelvic: External genitalia:  no lesions              Urethra:  normal appearing urethra with no masses, tenderness or lesions              Bartholins and Skenes: normal                 Vagina: normal appearing vagina with normal color and discharge, no lesions              Cervix: no lesions              Pap taken:  No. Bimanual Exam:  Uterus:  normal size, contour, position, consistency, mobility, non-tender              Adnexa: normal adnexa               Rectovaginal: Confirms               Anus:  normal sphincter tone, no lesions  Chaperone was present for exam.  A:  Well Woman with normal exam PMP, on HRT Dense breast tissue Family hx of osteoporosis (mother) HSV 2  P:   Mammogram guidelines reviewed Plan with MMG around age 50 pap smear with neg HR HPV 2091.  Not indicated today Rx for Valtrex to pharmacy.  Uses rarely. Combipatch RF 50/250, 1 patch to skin twice weekly.  #24/4RF Cologuard order will be placed Return annually or prn

## 2018-10-18 LAB — LIPID PANEL
Chol/HDL Ratio: 3.9 ratio (ref 0.0–4.4)
Cholesterol, Total: 231 mg/dL — ABNORMAL HIGH (ref 100–199)
HDL: 60 mg/dL (ref >39)
LDL Calculated: 156 mg/dL — ABNORMAL HIGH (ref 0–99)
Triglycerides: 74 mg/dL (ref 0–149)
VLDL Cholesterol Cal: 15 mg/dL (ref 5–40)

## 2018-10-18 LAB — COMPREHENSIVE METABOLIC PANEL
ALT: 7 IU/L (ref 0–32)
AST: 10 IU/L (ref 0–40)
Albumin/Globulin Ratio: 2.7 — ABNORMAL HIGH (ref 1.2–2.2)
Albumin: 4.8 g/dL (ref 3.8–4.8)
Alkaline Phosphatase: 46 IU/L (ref 39–117)
BUN/Creatinine Ratio: 17 (ref 9–23)
BUN: 13 mg/dL (ref 6–24)
Bilirubin Total: 0.6 mg/dL (ref 0.0–1.2)
CO2: 24 mmol/L (ref 20–29)
Calcium: 9.3 mg/dL (ref 8.7–10.2)
Chloride: 99 mmol/L (ref 96–106)
Creatinine, Ser: 0.75 mg/dL (ref 0.57–1.00)
GFR calc Af Amer: 107 mL/min/{1.73_m2} (ref >59)
GFR calc non Af Amer: 93 mL/min/{1.73_m2} (ref >59)
Globulin, Total: 1.8 g/dL (ref 1.5–4.5)
Glucose: 87 mg/dL (ref 65–99)
Potassium: 4.1 mmol/L (ref 3.5–5.2)
Sodium: 139 mmol/L (ref 134–144)
Total Protein: 6.6 g/dL (ref 6.0–8.5)

## 2018-10-18 LAB — CBC
Hematocrit: 39.4 % (ref 34.0–46.6)
Hemoglobin: 13.2 g/dL (ref 11.1–15.9)
MCH: 30.3 pg (ref 26.6–33.0)
MCHC: 33.5 g/dL (ref 31.5–35.7)
MCV: 90 fL (ref 79–97)
Platelets: 232 10*3/uL (ref 150–450)
RBC: 4.36 x10E6/uL (ref 3.77–5.28)
RDW: 12.5 % (ref 11.7–15.4)
WBC: 7.2 10*3/uL (ref 3.4–10.8)

## 2018-10-18 LAB — VITAMIN D 25 HYDROXY (VIT D DEFICIENCY, FRACTURES): Vit D, 25-Hydroxy: 23.8 ng/mL — ABNORMAL LOW (ref 30.0–100.0)

## 2018-10-18 LAB — TSH: TSH: 1.8 u[IU]/mL (ref 0.450–4.500)

## 2018-10-28 DIAGNOSIS — Z1211 Encounter for screening for malignant neoplasm of colon: Secondary | ICD-10-CM | POA: Diagnosis not present

## 2018-10-28 DIAGNOSIS — Z1212 Encounter for screening for malignant neoplasm of rectum: Secondary | ICD-10-CM | POA: Diagnosis not present

## 2018-10-31 LAB — COLOGUARD

## 2018-11-04 ENCOUNTER — Telehealth: Payer: Self-pay | Admitting: *Deleted

## 2018-11-04 NOTE — Telephone Encounter (Signed)
Left voicemail for patient with Negative Cologuard results. Report to scan.

## 2019-05-11 ENCOUNTER — Other Ambulatory Visit: Payer: Self-pay

## 2019-05-11 ENCOUNTER — Telehealth: Payer: Self-pay

## 2019-05-11 ENCOUNTER — Ambulatory Visit: Payer: 59 | Admitting: Certified Nurse Midwife

## 2019-05-11 ENCOUNTER — Encounter: Payer: Self-pay | Admitting: Certified Nurse Midwife

## 2019-05-11 ENCOUNTER — Telehealth: Payer: Self-pay | Admitting: *Deleted

## 2019-05-11 VITALS — BP 104/70 | HR 64 | Temp 98.2°F | Resp 16 | Wt 114.0 lb

## 2019-05-11 DIAGNOSIS — N6320 Unspecified lump in the left breast, unspecified quadrant: Secondary | ICD-10-CM | POA: Diagnosis not present

## 2019-05-11 DIAGNOSIS — N644 Mastodynia: Secondary | ICD-10-CM | POA: Diagnosis not present

## 2019-05-11 NOTE — Telephone Encounter (Signed)
Patient called after noticing a knot in her breast and would like to make an appointment.

## 2019-05-11 NOTE — Telephone Encounter (Signed)
Spoke with Crystal Macdonald at [facility]. Patient scheduled for 05/15/19 at 10am arrive at 9:40am.   Spoke with patient, advised as seen above. Patient verbalizes understanding and is agreeable to date and time.   Placed in [place] hold.   Routing to provider for final review. Patient is agreeable to disposition. Will close encounter.

## 2019-05-11 NOTE — Telephone Encounter (Signed)
-----   Message from Regina Eck, CNM sent at 05/11/2019  2:53 PM EDT ----- Please schedule [facility] at [facility] for diagnostic mammogram and [place] of left breast for 3 masses noted with tenderness. She is aware she will be called

## 2019-05-11 NOTE — Patient Instructions (Signed)

## 2019-05-11 NOTE — Progress Notes (Signed)
   Subjective:   49 y.o. Married Caucasian female presents for evaluation of left breast tenderness and questionable mass in left breast. Onset of the symptoms was 5 days ago. Patient sought evaluation because of breast tenderness and questionable mass.  Contributing factors include family hx on mother's side, her cousin with cancer, not sure of initial site.. Denies any fever, chills or redness of area. Patient denies history of trauma, bites, or injuries. Last mammogram was 08/12/2018 which was normal.  Previous evaluation has included no workup.   Review of Systems Review of Systems  Constitutional: Negative.   HENT: Negative.   Eyes: Negative.   Respiratory: Negative.   Cardiovascular: Negative.        Tender left breast with mass noted  Gastrointestinal: Negative.   Genitourinary: Negative.   Musculoskeletal: Negative.   Skin: Negative.   Endo/Heme/Allergies: Negative.   Psychiatric/Behavioral: Negative.     Pertinent items are noted in HPI.   Objective:   General appearance: alert, cooperative, appears stated age and no distress Breasts: normal appearance, no masses or tenderness, No nipple retraction or dimpling, No nipple discharge or bleeding, No axillary or supraclavicular adenopathy, on right breast. Left breast at 1 o'clock pea size mobile mass cystic feel, at 3 o'clock bb size mass, 5 o'clock bean size  mobile mass, all slightly tender, cystic feel, no redness    Assessment:   ASSESSMENT:Patient is diagnosed with Left breast masses, left breast tenderness   Plan:   PLAN: Discussed findings of left breast and patient palpated area and agreed this is what she had felt. Discussed need for diagnostic mammogram and [place]. Patient agreeable. Patient will be called with appointment. Questions addressed regarding finding.  Rv prn

## 2019-05-11 NOTE — Telephone Encounter (Signed)
Spoke with patient. Patient reports "knot" in left breast and tenderness, noticed on 05/07/19. Spouse can also feel knot. Denies any skin changes, nipple d/c redness or warmth. Last MMG 08/12/18. Patient is requesting OV.   Offered OV with Dr. Sabra Heck on 05/14/19, patient declined, scheduled with Melvia Heaps, CNM for today at [place] prescreen negative, precautions reviewed.   Routing to provider for final review. Patient is agreeable to disposition. Will close encounter.  Cc: Melvia Heaps, CNM

## 2019-05-15 ENCOUNTER — Other Ambulatory Visit: Payer: Self-pay | Admitting: Certified Nurse Midwife

## 2019-05-15 ENCOUNTER — Ambulatory Visit
Admission: RE | Admit: 2019-05-15 | Discharge: 2019-05-15 | Disposition: A | Discharge status: Home / Self Care | Payer: 59 | Source: Ambulatory Visit | Attending: Certified Nurse Midwife | Admitting: Certified Nurse Midwife

## 2019-05-15 ENCOUNTER — Ambulatory Visit
Admission: RE | Admit: 2019-05-15 | Discharge: 2019-05-15 | Disposition: A | Discharge status: Home / Self Care | Payer: PRIVATE HEALTH INSURANCE | Source: Ambulatory Visit | Attending: Certified Nurse Midwife | Admitting: Certified Nurse Midwife

## 2019-05-15 DIAGNOSIS — N644 Mastodynia: Secondary | ICD-10-CM

## 2019-05-15 DIAGNOSIS — N6320 Unspecified lump in the left breast, unspecified quadrant: Secondary | ICD-10-CM

## 2019-08-17 ENCOUNTER — Ambulatory Visit
Admission: RE | Admit: 2019-08-17 | Discharge: 2019-08-17 | Disposition: A | Discharge status: Home / Self Care | Payer: 59 | Source: Ambulatory Visit | Attending: Certified Nurse Midwife | Admitting: Certified Nurse Midwife

## 2019-08-17 ENCOUNTER — Other Ambulatory Visit: Payer: Self-pay

## 2019-08-17 DIAGNOSIS — N644 Mastodynia: Secondary | ICD-10-CM

## 2019-08-17 DIAGNOSIS — N6320 Unspecified lump in the left breast, unspecified quadrant: Secondary | ICD-10-CM

## 2019-11-04 ENCOUNTER — Other Ambulatory Visit: Payer: Self-pay | Admitting: Obstetrics & Gynecology

## 2019-11-04 DIAGNOSIS — Z79890 Hormone replacement therapy (postmenopausal): Secondary | ICD-10-CM

## 2019-11-04 NOTE — Telephone Encounter (Signed)
Medication refill request: Combipatch 0.05 - 0.25mg   Last AEX:  10/17/18 Next AEX: 12/31/19 Last MMG (if hormonal medication request): 08/17/19 Neg  Refill authorized: 8/1

## 2019-11-04 NOTE — Telephone Encounter (Signed)
Patient is calling in regards to refill of patches. Patient states pharmacy cannot refill it.

## 2019-12-30 ENCOUNTER — Other Ambulatory Visit: Payer: Self-pay | Admitting: Obstetrics & Gynecology

## 2019-12-30 DIAGNOSIS — Z79890 Hormone replacement therapy (postmenopausal): Secondary | ICD-10-CM

## 2019-12-31 ENCOUNTER — Ambulatory Visit: Payer: BC Managed Care – PPO | Admitting: Obstetrics & Gynecology

## 2020-01-04 ENCOUNTER — Other Ambulatory Visit: Payer: Self-pay | Admitting: Obstetrics & Gynecology

## 2020-01-04 DIAGNOSIS — Z79890 Hormone replacement therapy (postmenopausal): Secondary | ICD-10-CM

## 2020-01-06 ENCOUNTER — Other Ambulatory Visit: Payer: Self-pay

## 2020-01-06 DIAGNOSIS — Z79890 Hormone replacement therapy (postmenopausal): Secondary | ICD-10-CM

## 2020-01-06 MED ORDER — COMBIPATCH 0.05-0.25 MG/DAY TD PTTW: 1.0000 | MEDICATED_PATCH | TRANSDERMAL | 0 refills | Status: DC

## 2020-01-06 NOTE — Telephone Encounter (Signed)
Call to patient. Patient notified prescription sent to pharmacy. Patient verbalized understanding.

## 2020-01-06 NOTE — Telephone Encounter (Signed)
Medication refill request: Combipatch  Last AEX:  10-17-2018 SM  Next AEX: 01-20-20 JJ Last MMG (if hormonal medication request): 08-17-19 density C/BIRADS 2 benign  Refill authorized: Today, please advise.  Spoke with patient. Patient states she is completely out of HRT patches. Aex scheduled for 01-20-20 with Dr. Talbert Nan. Patient requests refill be sent to [facility] on [address].   Medication pended for #8, 0RF. Please refill if appropriate.

## 2020-01-06 NOTE — Telephone Encounter (Signed)
Patient is calling in regards to refill of her patch. Patient states "she needs to change her last patch today and is completely out". Patient declined scheduling AEX with other providers at this office and would like a nurse to call her back.

## 2020-01-18 NOTE — Progress Notes (Signed)
49 y.o. [id number] Married White or Caucasian Not Hispanic or Latino female here for annual exam.  She feels like she is bloated at times. Has issue with constipation, needing to strain intermittently (this is new). Has a BM ~3 x a week (no change). Can also have issues with intermittent diarrhea. She was told she likely has IBS.   Menopause at 29, not having vasomotor symptoms. No vaginal bleeding. Sexually active, she previously used vaginal estrogen didn't help as well as the estrogen patch. She is on the combipatch, thinks she sleeps a little better with the patch. Wants to continue the patch.  She falls asleep easily, but doesn't stay asleep. Occasionally takes an over the counter sleep aid. She has tried melatonin, helped some.   She has a long h/o headaches, recently a little worse, thinks stress related. Can have a headache daily for weeks to a month, then can go months without a headache. Will f/u with her primary. Not worse with HRT.   H/O HSV, rare outbreaks. Needs refill of Valtrex.     Patient's last menstrual period was 05/20/2012.          Sexually active: Yes.    The current method of family planning is post menopausal status.    Exercising: No.  The patient does not participate in regular exercise at present. Smoker:  no  Health Maintenance: Pap:  07/17/17 Neg. HR HPV:neg              03/30/14 Neg. HR HPV:neg  History of abnormal Pap:  no MMG:  08/17/19 density C Bi-rads 2 benign  BMD:   Never  Colonoscopy: never  Cologuard 10/28/18: negative.  TDaP:  2017  Gardasil: none    reports that she has never smoked. She has never used smokeless tobacco. She reports that she does not drink alcohol and does not use drugs. Daughters are 73 and 31. Homemaker.   Past Medical History:  Diagnosis Date   Hyperlipidemia    STD (sexually transmitted disease)    HX HSV II    Past Surgical History:  Procedure Laterality Date   CESAREAN SECTION  1996   DILATION AND EVACUATION  0539    UMBILICAL HERNIA REPAIR  2006   with mesh--Dr. Deon Pilling    Current Outpatient Medications  Medication Sig Dispense Refill   Aspirin-Salicylamide-Caffeine (BC HEADACHE POWDER PO) Take by mouth as needed.     doxylamine, Sleep, (SLEEP-AID) 25 MG tablet Take 25 mg by mouth at bedtime as needed.     estradiol-norethindrone (COMBIPATCH) 0.05-0.25 MG/DAY Place 1 patch onto the skin 2 (two) times a week. 8 patch 0   ibuprofen (ADVIL,MOTRIN) 200 MG tablet Take 200 mg by mouth every 6 (six) hours as needed.     valACYclovir (VALTREX) 500 MG tablet TAKE 1 TABLET (500 MG TOTAL) BY MOUTH DAILY. TAKE AS DIRECTED. 30 tablet 1   VITAMIN D PO Take by mouth. Twice weekly     No current facility-administered medications for this visit.    Family History  Problem Relation Age of Onset   Diabetes Paternal Grandmother        adult onset   Stroke Paternal Grandmother        deceased   Hypertension Mother    Hypertension Father    Stroke Paternal Grandfather        deceased   Heart attack Maternal Uncle        deceased age 59   Heart attack Maternal Grandmother  Heart attack Maternal Aunt    Heart attack Maternal Grandfather    Heart attack Maternal Uncle    Heart attack Maternal Aunt    Breast cancer Neg Hx   Strong FH of MI's.   Review of Systems  All other systems reviewed and are negative.   Exam:   BP 118/64    Pulse 66    Ht 5\' 4"  (1.626 m)    Wt 118 lb 9.6 oz (53.8 kg)    LMP 05/20/2012    SpO2 98%    BMI 20.36 kg/m   Weight change: @WEIGHTCHANGE @ Height:   Height: 5\' 4"  (162.6 cm)  Ht Readings from Last 3 Encounters:  01/20/20 5\' 4"  (1.626 m)  10/17/18 5\' 4"  (1.626 m)  07/25/18 5' 4.5" (1.638 m)    General appearance: alert, cooperative and appears stated age Head: Normocephalic, without obvious abnormality, atraumatic Neck: no adenopathy, supple, symmetrical, trachea midline and thyroid normal to inspection and palpation Lungs: clear to auscultation  bilaterally Cardiovascular: regular rate and rhythm Breasts: normal appearance, no masses or tenderness Abdomen: soft, non-tender; non distended,  no masses,  no organomegaly Extremities: extremities normal, atraumatic, no cyanosis or edema Skin: Skin color, texture, turgor normal. No rashes or lesions Lymph nodes: Cervical, supraclavicular, and axillary nodes normal. No abnormal inguinal nodes palpated Neurologic: Grossly normal   Pelvic: External genitalia:  no lesions              Urethra:  normal appearing urethra with no masses, tenderness or lesions              Bartholins and Skenes: normal                 Vagina: normal appearing vagina with normal color and discharge, no lesions              Cervix: nabothian cyst               Bimanual Exam:  Uterus:  normal size, contour, position, consistency, mobility, non-tender              Adnexa: no mass, fullness, tenderness               Rectovaginal: on RV exam a mass is felt in the region of the right adnexa (suspect stool)  Gae Dry chaperoned for the exam.  A:  Well Woman with normal exam  Vit d def  Elevated LDL  Bloating  H/O left breast cyst  HRT, discussed risks and benefits  Constipation, information given    P:   No pap this year  Mammogram in 6/22  Cologuard is UTD  Discussed watching her diet in relation to her bloating, f/u with primary if persists  Continue HRT  Valtrex for prn use  F/U for repeat pelvic exam, if mass persists will set up an ultrasound

## 2020-01-20 ENCOUNTER — Other Ambulatory Visit: Payer: Self-pay

## 2020-01-20 ENCOUNTER — Ambulatory Visit: Payer: 59 | Admitting: Obstetrics and Gynecology

## 2020-01-20 ENCOUNTER — Encounter: Payer: Self-pay | Admitting: Obstetrics and Gynecology

## 2020-01-20 VITALS — BP 118/64 | HR 66 | Ht 64.0 in | Wt 118.6 lb

## 2020-01-20 DIAGNOSIS — E559 Vitamin D deficiency, unspecified: Secondary | ICD-10-CM | POA: Diagnosis not present

## 2020-01-20 DIAGNOSIS — Z79890 Hormone replacement therapy (postmenopausal): Secondary | ICD-10-CM | POA: Diagnosis not present

## 2020-01-20 DIAGNOSIS — E7800 Pure hypercholesterolemia, unspecified: Secondary | ICD-10-CM

## 2020-01-20 DIAGNOSIS — N949 Unspecified condition associated with female genital organs and menstrual cycle: Secondary | ICD-10-CM

## 2020-01-20 DIAGNOSIS — R140 Abdominal distension (gaseous): Secondary | ICD-10-CM

## 2020-01-20 DIAGNOSIS — Z01419 Encounter for gynecological examination (general) (routine) without abnormal findings: Secondary | ICD-10-CM | POA: Diagnosis not present

## 2020-01-20 DIAGNOSIS — Z0000 Encounter for general adult medical examination without abnormal findings: Secondary | ICD-10-CM

## 2020-01-20 DIAGNOSIS — K5900 Constipation, unspecified: Secondary | ICD-10-CM

## 2020-01-20 MED ORDER — COMBIPATCH 0.05-0.25 MG/DAY TD PTTW: 1.0000 | MEDICATED_PATCH | TRANSDERMAL | 3 refills | Status: DC

## 2020-01-20 MED ORDER — VALACYCLOVIR HCL 500 MG PO TABS: ORAL_TABLET | ORAL | 1 refills | Status: DC

## 2020-01-20 NOTE — Patient Instructions (Addendum)
EXERCISE   We recommended that you start or continue a regular exercise program for good health. Physical activity is anything that gets your body moving, some is better than none. The CDC recommends 150 minutes per week of Moderate-Intensity Aerobic Activity and 2 or more days of Muscle Strengthening Activity.  Benefits of exercise are limitless: helps weight loss/weight maintenance, improves mood and energy, helps with depression and anxiety, improves sleep, tones and strengthens muscles, improves balance, improves bone density, protects from chronic conditions such as heart disease, high blood pressure and diabetes and so much more. To learn more visit: https://www.cdc.gov/physicalactivity/index.html  DIET: Good nutrition starts with a healthy diet of fruits, vegetables, whole grains, and lean protein sources. Drink plenty of water for hydration. Minimize empty calories, sodium, sweets. For more information about dietary recommendations visit: https://health.gov/our-work/nutrition-physical-activity/dietary-guidelines and https://www.myplate.gov/  ALCOHOL:  Women should limit their alcohol intake to no more than 7 drinks/beers/glasses of wine (combined, not each!) per week. Moderation of alcohol intake to this level decreases your risk of breast cancer and liver damage.  If you are concerned that you may have a problem, or your friends have told you they are concerned about your drinking, there are many resources to help. A well-known program that is free, effective, and available to all people all over the nation is Alcoholics Anonymous.  Check out this site to learn more: https://www.aa.org/   CALCIUM AND VITAMIN D:  Adequate intake of calcium and Vitamin D are recommended for bone health.  The recommendations for exact amounts of these supplements seem to change often, but generally speaking 1000-1500 mg of calcium (between diet and supplement) and 800 units of Vitamin D per day seems prudent.      PAP SMEARS:  Pap smears, to check for cervical cancer or precancers,  have traditionally been done yearly, although recent scientific advances have shown that most women can have pap smears less often.  However, every woman still should have a physical exam from her gynecologist every year. It will include a breast check, inspection of the vulva and vagina to check for abnormal growths or skin changes, a visual exam of the cervix, and then an exam to evaluate the size and shape of the uterus and ovaries.  And after 51 years of age, a rectal exam is indicated to check for rectal cancers. We will also provide age appropriate advice regarding health maintenance, like when you should have certain vaccines, screening for sexually transmitted diseases, bone density testing, colonoscopy, mammograms, etc.   MAMMOGRAMS:  All women over 40 years old should have a routine mammogram.   COLON CANCER SCREENING: Now recommend starting at age 45. At this time colonoscopy is not covered for routine screening until 50. There are take home tests that can be done between 45-49.   COLONOSCOPY:  Colonoscopy to screen for colon cancer is recommended for all women at age 50.  We know, you hate the idea of the prep.  We agree, BUT, having colon cancer and not knowing it is worse!!  Colon cancer so often starts as a polyp that can be seen and removed at colonscopy, which can quite literally save your life!  And if your first colonoscopy is normal and you have no family history of colon cancer, most women don't have to have it again for 10 years.  Once every ten years, you can do something that may end up saving your life, right?  We will be happy to help you get it scheduled when   you are ready.  Be sure to check your insurance coverage so you understand how much it will cost.  It may be covered as a preventative service at no cost, but you should check your particular policy.      Breast Self-Awareness Breast self-awareness  means being familiar with how your breasts look and feel. It involves checking your breasts regularly and reporting any changes to your health care provider. Practicing breast self-awareness is important. A change in your breasts can be a sign of a serious medical problem. Being familiar with how your breasts look and feel allows you to find any problems early, when treatment is more likely to be successful. All women should practice breast self-awareness, including women who have had breast implants. How to do a breast self-exam One way to learn what is normal for your breasts and whether your breasts are changing is to do a breast self-exam. To do a breast self-exam: Look for Changes  1. Remove all the clothing above your waist. 2. Stand in front of a mirror in a room with good lighting. 3. Put your hands on your hips. 4. Push your hands firmly downward. 5. Compare your breasts in the mirror. Look for differences between them (asymmetry), such as: ? Differences in shape. ? Differences in size. ? Puckers, dips, and bumps in one breast and not the other. 6. Look at each breast for changes in your skin, such as: ? Redness. ? Scaly areas. 7. Look for changes in your nipples, such as: ? Discharge. ? Bleeding. ? Dimpling. ? Redness. ? A change in position. Feel for Changes Carefully feel your breasts for lumps and changes. It is best to do this while lying on your back on the floor and again while sitting or standing in the shower or tub with soapy water on your skin. Feel each breast in the following way:  Place the arm on the side of the breast you are examining above your head.  Feel your breast with the other hand.  Start in the nipple area and make  inch (2 cm) overlapping circles to feel your breast. Use the pads of your three middle fingers to do this. Apply light pressure, then medium pressure, then firm pressure. The light pressure will allow you to feel the tissue closest to the  skin. The medium pressure will allow you to feel the tissue that is a little deeper. The firm pressure will allow you to feel the tissue close to the ribs.  Continue the overlapping circles, moving downward over the breast until you feel your ribs below your breast.  Move one finger-width toward the center of the body. Continue to use the  inch (2 cm) overlapping circles to feel your breast as you move slowly up toward your collarbone.  Continue the up and down exam using all three pressures until you reach your armpit.  Write Down What You Find  Write down what is normal for each breast and any changes that you find. Keep a written record with breast changes or normal findings for each breast. By writing this information down, you do not need to depend only on memory for size, tenderness, or location. Write down where you are in your menstrual cycle, if you are still menstruating. If you are having trouble noticing differences in your breasts, do not get discouraged. With time you will become more familiar with the variations in your breasts and more comfortable with the exam. How often should I examine   my breasts? Examine your breasts every month. If you are breastfeeding, the best time to examine your breasts is after a feeding or after using a breast pump. If you menstruate, the best time to examine your breasts is 5-7 days after your period is over. During your period, your breasts are lumpier, and it may be more difficult to notice changes. When should I see my health care provider? See your health care provider if you notice:  A change in shape or size of your breasts or nipples.  A change in the skin of your breast or nipples, such as a reddened or scaly area.  Unusual discharge from your nipples.  A lump or thick area that was not there before.  Pain in your breasts.  Anything that concerns you.   Menopause and Hormone Replacement Therapy Menopause is a normal time of life  when menstrual periods stop completely and the ovaries stop producing the female hormones estrogen and progesterone. This lack of hormones can affect your health and cause undesirable symptoms. Hormone replacement therapy (HRT) can relieve some of those symptoms. What is hormone replacement therapy? HRT is the use of artificial (synthetic) hormones to replace hormones that your body has stopped producing because you have reached menopause. What are my options for HRT?  HRT may consist of the synthetic hormones estrogen and progestin, or it may consist of only estrogen (estrogen-only therapy). You and your health care provider will decide which form of HRT is best for you. If you choose to be on HRT and you have a uterus, estrogen and progestin are usually prescribed. Estrogen-only therapy is used for women who do not have a uterus. Possible options for taking HRT include:  Pills.  Patches.  Gels.  Sprays.  Vaginal cream.  Vaginal rings.  Vaginal inserts. The amount of hormone(s) that you take and how long you take the hormone(s) varies according to your health. It is important to:  Begin HRT with the lowest possible dosage.  Stop HRT as soon as your health care provider tells you to stop.  Work with your health care provider so that you feel informed and comfortable with your decisions. What are the benefits of HRT? HRT can reduce the frequency and severity of menopausal symptoms. Benefits of HRT vary according to the kind of symptoms that you have, how severe they are, and your overall health. HRT may help to improve the following symptoms of menopause:  Hot flashes and night sweats. These are sudden feelings of heat that spread over the face and body. The skin may turn red, like a blush. Night sweats are hot flashes that happen while you are sleeping or trying to sleep.  Bone loss (osteoporosis). The body loses calcium more quickly after menopause, causing the bones to become  weaker. This can increase the risk for bone breaks (fractures).  Vaginal dryness. The lining of the vagina can become thin and dry, which can cause pain during sex or cause infection, burning, or itching.  Urinary tract infections.  Urinary incontinence. This is the inability to control when you pass urine.  Irritability.  Short-term memory problems. What are the risks of HRT? Risks of HRT vary depending on your individual health and medical history. Risks of HRT also depend on whether you receive both estrogen and progestin or you receive estrogen only. HRT may increase the risk of:  Spotting. This is when a small amount of blood leaks from the vagina unexpectedly.  Endometrial cancer. This cancer is  in the lining of the uterus (endometrium).  Breast cancer.  Increased density of breast tissue. This can make it harder to find breast cancer on a breast X-ray (mammogram).  Stroke.  Heart disease.  Blood clots.  Gallbladder disease.  Liver disease. Risks of HRT can increase if you have any of the following conditions:  Endometrial cancer.  Liver disease.  Heart disease.  Breast cancer.  History of blood clots.  History of stroke. Follow these instructions at home:  Take over-the-counter and prescription medicines only as told by your health care provider.  Get mammograms, pelvic exams, and medical checkups as often as told by your health care provider.  Have Pap tests done as often as told by your health care provider. A Pap test is sometimes called a Pap smear. It is a screening test that is used to check for signs of cancer of the cervix and vagina. A Pap test can also identify the presence of infection or precancerous changes. Pap tests may be done: ? Every 3 years, starting at age 43. ? Every 5 years, starting after age 17, in combination with testing for human papillomavirus (HPV). ? More often or less often depending on other medical conditions you have, your  age, and other risk factors.  It is up to you to get the results of your Pap test. Ask your health care provider, or the department that is doing the test, when your results will be ready.  Keep all follow-up visits as told by your health care provider. This is important. Contact a health care provider if you have:  Pain or swelling in your legs.  Shortness of breath.  Chest pain.  Lumps or changes in your breasts or armpits.  Slurred speech.  Pain, burning, or bleeding when you urinate.  Unusual vaginal bleeding.  Dizziness or headaches.  Weakness or numbness in any part of your arms or legs.  Pain in your abdomen. Summary  Menopause is a normal time of life when menstrual periods stop completely and the ovaries stop producing the female hormones estrogen and progesterone.  Hormone replacement therapy (HRT) can relieve some of the symptoms of menopause.  HRT can reduce the frequency and severity of menopausal symptoms.  Risks of HRT vary depending on your individual health and medical history. This information is not intended to replace advice given to you by your health care provider. Make sure you discuss any questions you have with your health care provider. Document Revised: 10/08/2017 Document Reviewed: 10/08/2017 Elsevier Patient Education  [place].  Abdominal Bloating When you have abdominal bloating, your abdomen may feel full, tight, or painful. It may also look bigger than normal or swollen (distended). Common causes of abdominal bloating include:  Swallowing air.  Constipation.  Problems digesting food.  Eating too much.  Irritable bowel syndrome. This is a condition that affects the large intestine.  Lactose intolerance. This is an inability to digest lactose, a natural sugar in dairy products.  Celiac disease. This is a condition that affects the ability to digest gluten, a protein found in some grains.  Gastroparesis. This is a  condition that slows down the movement of food in the stomach and small intestine. It is more common in people with diabetes mellitus.  Gastroesophageal reflux disease (GERD). This is a digestive condition that makes stomach acid flow back into the esophagus.  Urinary retention. This means that the body is holding onto urine, and the bladder cannot be emptied all the way.  Follow these instructions at home: Eating and drinking  Avoid eating too much.  Try not to swallow air while talking or eating.  Avoid eating while lying down.  Avoid these foods and drinks: ? Foods that cause gas, such as broccoli, cabbage, cauliflower, and baked beans. ? Carbonated drinks. ? Hard candy. ? Chewing gum. Medicines  Take over-the-counter and prescription medicines only as told by your health care provider.  Take probiotic medicines. These medicines contain live bacteria or yeasts that can help digestion.  Take coated peppermint oil capsules. Activity  Try to exercise regularly. Exercise may help to relieve bloating that is caused by gas and relieve constipation. General instructions  Keep all follow-up visits as told by your health care provider. This is important. Contact a health care provider if:  You have nausea and vomiting.  You have diarrhea.  You have abdominal pain.  You have unusual weight loss or weight gain.  You have severe pain, and medicines do not help. Get help right away if:  You have severe chest pain.  You have trouble breathing.  You have shortness of breath.  You have trouble urinating.  You have darker urine than normal.  You have blood in your stools or have dark, tarry stools. Summary  Abdominal bloating means that the abdomen is swollen.  Common causes of abdominal bloating are swallowing air, constipation, and problems digesting food.  Avoid eating too much and avoid swallowing air.  Avoid foods that cause gas, carbonated drinks, hard candy,  and chewing gum. This information is not intended to replace advice given to you by your health care provider. Make sure you discuss any questions you have with your health care provider. Document Revised: 05/26/2018 Document Reviewed: 03/09/2016 Elsevier Patient Education  [place].  About Constipation  Constipation Overview Constipation is the most common gastrointestinal complaint -- about 4 million Americans experience constipation and make 2.5 million physician visits a year to get help for the problem.  Constipation can occur when the colon absorbs too much water, the colon's muscle contraction is slow or sluggish, and/or there is delayed transit time through the colon.  The result is stool that is hard and dry.  Indicators of constipation include straining during bowel movements greater than 25% of the time, having fewer than three bowel movements per week, and/or the feeling of incomplete evacuation.  There are established guidelines ([place] II ) for defining constipation. A person needs to have two or more of the following symptoms for at least 12 weeks (not necessarily consecutive) in the preceding 12 months: . Straining in  greater than 25% of bowel movements . Lumpy or hard stools in greater than 25% of bowel movements . Sensation of incomplete emptying in greater than 25% of bowel movements . Sensation of anorectal obstruction/blockade in greater than 25% of bowel movements . Manual maneuvers to help empty greater than 25% of bowel movements (e.g., digital evacuation, support of the pelvic floor)  . Less than  3 bowel movements/week . Loose stools are not present, and criteria for irritable bowel syndrome are insufficient  Common Causes of Constipation . Lack of fiber in your diet . Lack of physical activity . Medications, including iron and calcium supplements  . Dairy intake . Dehydration . Abuse of laxatives  Travel  Irritable Bowel Syndrome  Pregnancy  Luteal  phase of menstruation (after ovulation and before menses)  Colorectal problems  Intestinal Dysfunction  Treating Constipation  There are several ways of treating constipation,  including changes to diet and exercise, use of laxatives, adjustments to the pelvic floor, and scheduled toileting.  These treatments include: . increasing fiber and fluids in the diet  . increasing physical activity . learning muscle coordination   learning proper toileting techniques and toileting modifications   designing and sticking  to a toileting schedule     2007, [employer] Doc.22

## 2020-01-21 LAB — COMPREHENSIVE METABOLIC PANEL
ALT: 9 IU/L (ref 0–32)
AST: 12 IU/L (ref 0–40)
Albumin/Globulin Ratio: 2.5 — ABNORMAL HIGH (ref 1.2–2.2)
Albumin: 4.9 g/dL (ref 3.8–4.9)
Alkaline Phosphatase: 47 IU/L (ref 44–121)
BUN/Creatinine Ratio: 13 (ref 9–23)
BUN: 8 mg/dL (ref 6–24)
Bilirubin Total: 0.4 mg/dL (ref 0.0–1.2)
CO2: 25 mmol/L (ref 20–29)
Calcium: 9.4 mg/dL (ref 8.7–10.2)
Chloride: 103 mmol/L (ref 96–106)
Creatinine, Ser: 0.6 mg/dL (ref 0.57–1.00)
GFR calc Af Amer: 122 mL/min/{1.73_m2} (ref >59)
GFR calc non Af Amer: 106 mL/min/{1.73_m2} (ref >59)
Globulin, Total: 2 g/dL (ref 1.5–4.5)
Glucose: 90 mg/dL (ref 65–99)
Potassium: 4.1 mmol/L (ref 3.5–5.2)
Sodium: 140 mmol/L (ref 134–144)
Total Protein: 6.9 g/dL (ref 6.0–8.5)

## 2020-01-21 LAB — CBC
Hematocrit: 38.3 % (ref 34.0–46.6)
Hemoglobin: 12.9 g/dL (ref 11.1–15.9)
MCH: 30.4 pg (ref 26.6–33.0)
MCHC: 33.7 g/dL (ref 31.5–35.7)
MCV: 90 fL (ref 79–97)
Platelets: 254 10*3/uL (ref 150–450)
RBC: 4.25 x10E6/uL (ref 3.77–5.28)
RDW: 12.6 % (ref 11.7–15.4)
WBC: 6.3 10*3/uL (ref 3.4–10.8)

## 2020-01-21 LAB — LIPID PANEL
Chol/HDL Ratio: 3.5 ratio (ref 0.0–4.4)
Cholesterol, Total: 225 mg/dL — ABNORMAL HIGH (ref 100–199)
HDL: 64 mg/dL (ref >39)
LDL Chol Calc (NIH): 153 mg/dL — ABNORMAL HIGH (ref 0–99)
Triglycerides: 49 mg/dL (ref 0–149)
VLDL Cholesterol Cal: 8 mg/dL (ref 5–40)

## 2020-01-21 LAB — VITAMIN D 25 HYDROXY (VIT D DEFICIENCY, FRACTURES): Vit D, 25-Hydroxy: 22.3 ng/mL — ABNORMAL LOW (ref 30.0–100.0)

## 2020-01-26 ENCOUNTER — Other Ambulatory Visit: Payer: Self-pay | Admitting: Obstetrics and Gynecology

## 2020-01-26 DIAGNOSIS — Z79890 Hormone replacement therapy (postmenopausal): Secondary | ICD-10-CM

## 2020-02-02 ENCOUNTER — Ambulatory Visit: Payer: 59 | Admitting: Obstetrics and Gynecology

## 2020-02-02 ENCOUNTER — Encounter: Payer: Self-pay | Admitting: Obstetrics and Gynecology

## 2020-02-02 ENCOUNTER — Other Ambulatory Visit: Payer: Self-pay

## 2020-02-02 VITALS — BP 122/62 | HR 66 | Ht 64.0 in | Wt 118.6 lb

## 2020-02-02 DIAGNOSIS — Z01419 Encounter for gynecological examination (general) (routine) without abnormal findings: Secondary | ICD-10-CM | POA: Diagnosis not present

## 2020-02-02 DIAGNOSIS — Z79890 Hormone replacement therapy (postmenopausal): Secondary | ICD-10-CM | POA: Diagnosis not present

## 2020-02-02 NOTE — Progress Notes (Signed)
GYNECOLOGY  VISIT   HPI: 49 y.o.   Married White or Caucasian Not Hispanic or Latino  female   [phone] with Patient's last menstrual period was 05/20/2012.   here for follow up repeat pelvic exam to check for mass.  At her annual exam a mass was felt in the right adnexa on RV exam, suspected stool. She is without abdominal/pelvic c/o.   When she refilled her combipatch it was very expensive. She is wondering if she should stay on it. On ERT she feels less anxious, sleeping better, sex drive is better, skin is less dry. Overall just feels better.   She has questions about her daughter who has been diagnosed with PCOS.  GYNECOLOGIC HISTORY: Patient's last menstrual period was 05/20/2012. Contraception:PMP  Menopausal hormone therapy: estradiol         OB History    Gravida  3   Para  2   Term  2   Preterm      AB  1   Living  2     SAB  1   IAB      Ectopic      Multiple      Live Births  2              Patient Active Problem List   Diagnosis Date Noted  . Cervical polyp 12/22/2014  . Other and unspecified hyperlipidemia 09/12/2012    Past Medical History:  Diagnosis Date  . Hyperlipidemia   . 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 CESAREAN SECTION  1996  . DILATION AND EVACUATION  1998  . UMBILICAL HERNIA REPAIR  2006   with mesh--Dr. Deon Pilling    Current Outpatient Medications  Medication Sig Dispense Refill  . Aspirin-Salicylamide-Caffeine (BC HEADACHE POWDER PO) Take by mouth as needed.    . doxylamine, Sleep, (SLEEP-AID) 25 MG tablet Take 25 mg by mouth at bedtime as needed.    Marland Kitchen estradiol-norethindrone (COMBIPATCH) 0.05-0.25 MG/DAY Place 1 patch onto the skin 2 (two) times a week. 24 patch 3  . ibuprofen (ADVIL,MOTRIN) 200 MG tablet Take 200 mg by mouth every 6 (six) hours as needed.    . valACYclovir (VALTREX) 500 MG tablet 1 tablet po BID x 3 days as needed 30 tablet 1  .  VITAMIN D PO Take 1,000 Units/day by mouth. Twice weekly     No current facility-administered medications for this visit.     ALLERGIES: Patient has no known allergies.  Family History  Problem Relation Age of Onset  . Diabetes Paternal Grandmother        adult onset  . Stroke Paternal Grandmother        deceased  . Hypertension Mother   . Hypertension Father   . Stroke Paternal Grandfather        deceased  . Heart attack Maternal Uncle        deceased age 7  . Heart attack Maternal Grandmother   . Heart attack Maternal Aunt   . Heart attack Maternal Grandfather   . Heart attack Maternal Uncle   . Heart attack Maternal Aunt   . Breast cancer Neg Hx     Social History   Socioeconomic History  . Marital status: Married    Spouse name: Not on file  . Number of children: Not on file  . Years of education: Not on file  . Highest education level: Not  on file  Occupational History  . Not on file  Tobacco Use  . Smoking status: Never Smoker  . Smokeless tobacco: Never Used  Vaping Use  . Vaping Use: Never used  Substance and Sexual Activity  . Alcohol use: No    Alcohol/week: 0.0 standard drinks  . Drug use: No  . Sexual activity: Yes    Partners: Male    Birth control/protection: Post-menopausal  Other Topics Concern  . Not on file  Social History Narrative  . Not on file   Social Determinants of Health   Financial Resource Strain: Not on file  Food Insecurity: Not on file  Transportation Needs: Not on file  Physical Activity: Not on file  Stress: Not on file  Social Connections: Not on file  Intimate Partner Violence: Not on file    Review of Systems  All other systems reviewed and are negative.   PHYSICAL EXAMINATION:    BP 122/62   Pulse 66   Ht 5\' 4"  (1.626 m)   Wt 118 lb 9.6 oz (53.8 kg)   LMP 05/20/2012   SpO2 100%   BMI 20.36 kg/m     General appearance: alert, cooperative and appears stated age  Pelvic: External genitalia:  no  lesions              Urethra:  normal appearing urethra with no masses, tenderness or lesions              Bartholins and Skenes: normal                 Cervix: no cervical motion tenderness              Bimanual Exam:  Uterus:  normal size, contour, position, consistency, mobility, non-tender              Adnexa: no mass, fullness, tenderness              Rectovaginal: Yes.  .  Confirms.              Anus:  normal sphincter tone, no lesions  Chaperone was present for exam.  ASSESSMENT Repeat exam adnexal mass is gone. Patient reassured Discussion on HRT, she is trying to decide if she wants to stay on the combipatch secondary to expense. We discussed the risks/benefits of HRT    PLAN If she wants to change HRT, she will call and I will call in a generic patch and nightly prometrium. Would keep her at the same dose of estrogen.    Over 20 minutes in total patient care

## 2020-11-24 ENCOUNTER — Other Ambulatory Visit: Payer: Self-pay | Admitting: Obstetrics and Gynecology

## 2020-11-24 ENCOUNTER — Other Ambulatory Visit: Payer: Self-pay | Admitting: Obstetrics & Gynecology

## 2020-11-24 DIAGNOSIS — Z1231 Encounter for screening mammogram for malignant neoplasm of breast: Secondary | ICD-10-CM

## 2020-12-22 ENCOUNTER — Ambulatory Visit
Admission: RE | Admit: 2020-12-22 | Discharge: 2020-12-22 | Disposition: A | Discharge status: Home / Self Care | Payer: 59 | Source: Ambulatory Visit | Attending: Obstetrics & Gynecology | Admitting: Obstetrics & Gynecology

## 2020-12-22 DIAGNOSIS — Z1231 Encounter for screening mammogram for malignant neoplasm of breast: Secondary | ICD-10-CM

## 2020-12-28 ENCOUNTER — Other Ambulatory Visit (HOSPITAL_BASED_OUTPATIENT_CLINIC_OR_DEPARTMENT_OTHER): Payer: Self-pay

## 2020-12-28 DIAGNOSIS — Z79890 Hormone replacement therapy (postmenopausal): Secondary | ICD-10-CM

## 2020-12-28 MED ORDER — COMBIPATCH 0.05-0.25 MG/DAY TD PTTW: 1.0000 | MEDICATED_PATCH | TRANSDERMAL | 0 refills | Status: DC

## 2021-01-23 ENCOUNTER — Ambulatory Visit (INDEPENDENT_AMBULATORY_CARE_PROVIDER_SITE_OTHER): Payer: 59 | Admitting: Obstetrics & Gynecology

## 2021-01-23 ENCOUNTER — Other Ambulatory Visit: Payer: Self-pay

## 2021-01-23 ENCOUNTER — Encounter (HOSPITAL_BASED_OUTPATIENT_CLINIC_OR_DEPARTMENT_OTHER): Payer: Self-pay | Admitting: Obstetrics & Gynecology

## 2021-01-23 ENCOUNTER — Other Ambulatory Visit (HOSPITAL_COMMUNITY)
Admission: RE | Admit: 2021-01-23 | Discharge: 2021-01-23 | Disposition: A | Discharge status: Home / Self Care | Payer: 59 | Source: Ambulatory Visit | Attending: Obstetrics & Gynecology | Admitting: Obstetrics & Gynecology

## 2021-01-23 VITALS — BP 128/81 | HR 81 | Ht 64.0 in | Wt 122.6 lb

## 2021-01-23 DIAGNOSIS — E559 Vitamin D deficiency, unspecified: Secondary | ICD-10-CM | POA: Insufficient documentation

## 2021-01-23 DIAGNOSIS — B009 Herpesviral infection, unspecified: Secondary | ICD-10-CM | POA: Diagnosis not present

## 2021-01-23 DIAGNOSIS — Z79890 Hormone replacement therapy (postmenopausal): Secondary | ICD-10-CM | POA: Diagnosis not present

## 2021-01-23 DIAGNOSIS — Z780 Asymptomatic menopausal state: Secondary | ICD-10-CM | POA: Diagnosis not present

## 2021-01-23 DIAGNOSIS — Z124 Encounter for screening for malignant neoplasm of cervix: Secondary | ICD-10-CM

## 2021-01-23 DIAGNOSIS — E7800 Pure hypercholesterolemia, unspecified: Secondary | ICD-10-CM | POA: Insufficient documentation

## 2021-01-23 DIAGNOSIS — Z01419 Encounter for gynecological examination (general) (routine) without abnormal findings: Secondary | ICD-10-CM | POA: Diagnosis not present

## 2021-01-23 DIAGNOSIS — F5101 Primary insomnia: Secondary | ICD-10-CM | POA: Insufficient documentation

## 2021-01-23 MED ORDER — NORETHINDRONE ACETATE 5 MG PO TABS: 2.5000 mg | ORAL_TABLET | Freq: Every day | ORAL | 6 refills | Status: DC

## 2021-01-23 MED ORDER — ESTRADIOL 0.05 MG/24HR TD PTTW: 1.0000 | MEDICATED_PATCH | TRANSDERMAL | 12 refills | Status: DC

## 2021-01-23 MED ORDER — VALACYCLOVIR HCL 500 MG PO TABS: ORAL_TABLET | ORAL | 1 refills | Status: DC

## 2021-01-23 NOTE — Progress Notes (Signed)
50 y.o. [id number] Married White or Caucasian female here for annual exam.  Father passed earlier this year.  He has many medical issues.  The holidays are different this year.  Denies vaginal bleeding.  On HRT.  Doing well with this but would like a smaller patch.  Will switch to estradiol only patch and oral progesterone.    Patient's last menstrual period was 05/20/2012.          Sexually active: Yes.    The current method of family planning is post menopausal status.    Exercising: No.   Smoker:  no  Health Maintenance: Pap:  07/17/2017 Negative History of abnormal Pap:  no MMG:  12/22/2020 Negative Colonoscopy:  cologuard 10/2020 BMD:   plan around age 67 due to family history Screening Labs: with Dr. Nancy Fetter   reports that she has never smoked. She has never used smokeless tobacco. She reports that she does not drink alcohol and does not use drugs.  Past Medical History:  Diagnosis Date   Hyperlipidemia    STD (sexually transmitted disease)    HX HSV II    Past Surgical History:  Procedure Laterality Date   CESAREAN SECTION  1996   DILATION AND EVACUATION  3086   UMBILICAL HERNIA REPAIR  2006   with mesh--Dr. Deon Pilling    Current Outpatient Medications  Medication Sig Dispense Refill   Aspirin-Salicylamide-Caffeine (BC HEADACHE POWDER PO) Take by mouth as needed.     doxylamine, Sleep, (SLEEP-AID) 25 MG tablet Take 25 mg by mouth at bedtime as needed.     estradiol-norethindrone (COMBIPATCH) 0.05-0.25 MG/DAY Place 1 patch onto the skin 2 (two) times a week. 24 patch 0   ibuprofen (ADVIL,MOTRIN) 200 MG tablet Take 200 mg by mouth every 6 (six) hours as needed.     valACYclovir (VALTREX) 500 MG tablet 1 tablet po BID x 3 days as needed 30 tablet 1   vitamin B-12 (CYANOCOBALAMIN) 500 MCG tablet Take 500 mcg by mouth daily.     VITAMIN D PO Take 1,000 Units/day by mouth. Twice weekly     No current facility-administered medications for this visit.    Family History  Problem  Relation Age of Onset   Diabetes Paternal Grandmother        adult onset   Stroke Paternal Grandmother        deceased   Hypertension Mother    Hypertension Father    Stroke Paternal Grandfather        deceased   Heart attack Maternal Uncle        deceased age 68   Heart attack Maternal Grandmother    Heart attack Maternal Aunt    Heart attack Maternal Grandfather    Heart attack Maternal Uncle    Heart attack Maternal Aunt    Breast cancer Neg Hx     Review of Systems  All other systems reviewed and are negative.  Exam:   BP 128/81 (BP Location: Left Arm, Patient Position: Sitting, Cuff Size: Normal)   Pulse 81   Ht 5\' 4"  (1.626 m) Comment: reported  Wt 122 lb 9.6 oz (55.6 kg)   LMP 05/20/2012   BMI 21.04 kg/m   Height: 5\' 4"  (162.6 cm) (reported)  General appearance: alert, cooperative and appears stated age Head: Normocephalic, without obvious abnormality, atraumatic Neck: no adenopathy, supple, symmetrical, trachea midline and thyroid normal to inspection and palpation Lungs: clear to auscultation bilaterally Breasts: normal appearance, no masses or tenderness Heart: regular rate and  rhythm Abdomen: soft, non-tender; bowel sounds normal; no masses,  no organomegaly Extremities: extremities normal, atraumatic, no cyanosis or edema Skin: Skin color, texture, turgor normal. No rashes or lesions Lymph nodes: Cervical, supraclavicular, and axillary nodes normal. No abnormal inguinal nodes palpated Neurologic: Grossly normal   Pelvic: External genitalia:  no lesions              Urethra:  normal appearing urethra with no masses, tenderness or lesions              Bartholins and Skenes: normal                 Vagina: normal appearing vagina with normal color and no discharge, no lesions              Cervix: no lesions              Pap taken: Yes.   Bimanual Exam:  Uterus:  normal size, contour, position, consistency, mobility, non-tender              Adnexa: normal  adnexa and no mass, fullness, tenderness               Rectovaginal: Confirms               Anus:  normal sphincter tone, no lesions  Chaperone, Octaviano Batty, CMA, was present for exam.  Assessment/Plan: 1. Well woman exam with routine gynecological exam - pap and HR HPV obtained today - MMG up to date - cologuard negative 2020.  Plan colonoscopy next year - plan BMD around age 13- - lab work done with Dr. Nancy Fetter  2. Postmenopausal  3. Hormone replacement therapy (HRT) - will change to vivelle dot 0.05mg  patches twice weekly and aygestin 47m, 1/2 tab daily.  Rx to pharmacy.  4. HSV-2 infection - valACYclovir (VALTREX) 500 MG tablet; 1 tablet po BID x 3 days as needed  Dispense: 30 tablet; Refill: 1

## 2021-01-26 LAB — CYTOLOGY - PAP
Comment: NEGATIVE
Diagnosis: NEGATIVE
High risk HPV: NEGATIVE

## 2021-02-08 ENCOUNTER — Other Ambulatory Visit (HOSPITAL_BASED_OUTPATIENT_CLINIC_OR_DEPARTMENT_OTHER): Payer: Self-pay | Admitting: Obstetrics & Gynecology

## 2021-02-08 DIAGNOSIS — B009 Herpesviral infection, unspecified: Secondary | ICD-10-CM

## 2021-11-14 ENCOUNTER — Other Ambulatory Visit: Payer: Self-pay | Admitting: Obstetrics & Gynecology

## 2021-11-14 DIAGNOSIS — Z1231 Encounter for screening mammogram for malignant neoplasm of breast: Secondary | ICD-10-CM

## 2021-12-25 ENCOUNTER — Ambulatory Visit: Payer: 59

## 2022-01-16 ENCOUNTER — Ambulatory Visit: Payer: 59

## 2022-01-25 ENCOUNTER — Encounter (HOSPITAL_BASED_OUTPATIENT_CLINIC_OR_DEPARTMENT_OTHER): Payer: Self-pay | Admitting: Obstetrics & Gynecology

## 2022-01-25 ENCOUNTER — Ambulatory Visit (INDEPENDENT_AMBULATORY_CARE_PROVIDER_SITE_OTHER): Payer: 59 | Admitting: Obstetrics & Gynecology

## 2022-01-25 VITALS — BP 125/88 | HR 77 | Ht 63.5 in | Wt 127.8 lb

## 2022-01-25 DIAGNOSIS — B009 Herpesviral infection, unspecified: Secondary | ICD-10-CM | POA: Diagnosis not present

## 2022-01-25 DIAGNOSIS — Z01419 Encounter for gynecological examination (general) (routine) without abnormal findings: Secondary | ICD-10-CM

## 2022-01-25 DIAGNOSIS — R251 Tremor, unspecified: Secondary | ICD-10-CM

## 2022-01-25 DIAGNOSIS — R5383 Other fatigue: Secondary | ICD-10-CM

## 2022-01-25 DIAGNOSIS — Z1211 Encounter for screening for malignant neoplasm of colon: Secondary | ICD-10-CM | POA: Diagnosis not present

## 2022-01-25 DIAGNOSIS — Z13820 Encounter for screening for osteoporosis: Secondary | ICD-10-CM | POA: Diagnosis not present

## 2022-01-25 MED ORDER — VALACYCLOVIR HCL 500 MG PO TABS: ORAL_TABLET | ORAL | 1 refills | Status: DC

## 2022-01-25 MED ORDER — ESTRADIOL 0.05 MG/24HR TD PTTW: 1.0000 | MEDICATED_PATCH | TRANSDERMAL | 3 refills | Status: DC

## 2022-01-25 MED ORDER — NORETHINDRONE ACETATE 5 MG PO TABS: 2.5000 mg | ORAL_TABLET | Freq: Every day | ORAL | 3 refills | Status: DC

## 2022-01-25 NOTE — Progress Notes (Signed)
51 y.o. [id number] Married White or Caucasian female here for annual exam.  Having a lot of fatigue.  Had to switch away from PCP she saw for many years.  Was a very good fit for her.  Feels like her symptoms are all over the place and she's not really sure what to do.  Has some hand tremors, muscle cramps that are intermittent, intermittent right sided head pain that requires her to take a BC powder almost every day.  Does have hx of migraines but this is different.  Also feeling some muscle twitching that is intermittent as well.  Dr. Nancy Fetter offered neurology referral.    On HRT.  Does not want to make any changes.  Denies vaginal bleeding.    Worried about her height loss as well.  Patient's last menstrual period was 05/20/2012.          Sexually active: Yes.    The current method of family planning is post menopausal status.    Smoker:  no  Health Maintenance: Pap:  01/23/21 neg History of abnormal Pap:  no MMG:  01/16/22 Cologuard:  10/28/18 neg BMD:   ordered Screening Labs: ordered today   reports that she has never smoked. She has never used smokeless tobacco. She reports that she does not drink alcohol and does not use drugs.  Past Medical History:  Diagnosis Date   Hyperlipidemia    Insomnia    STD (sexually transmitted disease)    HX HSV II    Past Surgical History:  Procedure Laterality Date   CESAREAN SECTION  1996   DILATION AND EVACUATION  4540   UMBILICAL HERNIA REPAIR  2006   with mesh--Dr. Deon Pilling    Current Outpatient Medications  Medication Sig Dispense Refill   Aspirin-Salicylamide-Caffeine (BC HEADACHE POWDER PO) Take by mouth as needed.     estradiol (VIVELLE-DOT) 0.05 MG/24HR patch Place 1 patch (0.05 mg total) onto the skin 2 (two) times a week. 8 patch 12   ibuprofen (ADVIL,MOTRIN) 200 MG tablet Take 200 mg by mouth every 6 (six) hours as needed.     norethindrone (AYGESTIN) 5 MG tablet Take 0.5 tablets (2.5 mg total) by mouth daily. 30 tablet 6    valACYclovir (VALTREX) 500 MG tablet 1 TABLET BY MOUTH TWICE A DAY X 3 DAYS AS NEEDED 30 tablet 1   vitamin B-12 (CYANOCOBALAMIN) 500 MCG tablet Take 500 mcg by mouth daily.     VITAMIN D PO Take 1,000 Units/day by mouth. Twice weekly     No current facility-administered medications for this visit.    Family History  Problem Relation Age of Onset   Stroke Paternal Grandfather        deceased   Diabetes Paternal Grandmother        adult onset   Stroke Paternal Grandmother        deceased   Heart attack Maternal Grandmother    Heart attack Maternal Grandfather    Hypertension Father    AAA (abdominal aortic aneurysm) Father    Hypertension Mother    Heart attack Maternal Aunt    Heart attack Maternal Aunt    Heart attack Maternal Uncle        deceased age 26   Heart attack Maternal Uncle    Breast cancer Neg Hx     ROS: Constitutional: negative Genitourinary:negative  Exam:   LMP 05/20/2012      General appearance: alert, cooperative and appears stated age Head: Normocephalic, without obvious abnormality, atraumatic  Neck: no adenopathy, supple, symmetrical, trachea midline and thyroid normal to inspection and palpation Lungs: clear to auscultation bilaterally Breasts: normal appearance, no masses or tenderness Heart: regular rate and rhythm Abdomen: soft, non-tender; bowel sounds normal; no masses,  no organomegaly Extremities: extremities normal, atraumatic, no cyanosis or edema Skin: Skin color, texture, turgor normal. No rashes or lesions Lymph nodes: Cervical, supraclavicular, and axillary nodes normal. No abnormal inguinal nodes palpated Neurologic: Grossly normal   Pelvic: External genitalia:  no lesions              Urethra:  normal appearing urethra with no masses, tenderness or lesions              Bartholins and Skenes: normal                 Vagina: normal appearing vagina with normal color and no discharge, no lesions              Cervix: no lesions               Pap taken: No. Bimanual Exam:  Uterus:  normal size, contour, position, consistency, mobility, non-tender              Adnexa: normal adnexa and no mass, fullness, tenderness               Rectovaginal: Confirms               Anus:  normal sphincter tone, no lesions  Chaperone, Ezekiel Ina, RN, was present for exam.  Assessment/Plan: 1. Well woman exam with routine gynecological exam - Pap smear 2022 neg with neg HR HPV.  Not indicated today. - Mammogram done per pt at [facility].  Will call for copy. - Colonoscopy referral made - Bone mineral density ordered - vaccines reviewed/updated  2. HSV-2 infection - valACYclovir (VALTREX) 500 MG tablet; 1 TABLET BY MOUTH TWICE A DAY X 3 DAYS AS NEEDED  Dispense: 30 tablet; Refill: 1  3. Colon cancer screening - Ambulatory referral to Gastroenterology  4. Osteoporosis screening - DG BONE DENSITY (DXA); Future  5. Tremor - Ambulatory referral to Neurology - CBC with Differential/Platelet - Comprehensive metabolic panel - TSH  6. Fatigue, unspecified type

## 2022-01-25 NOTE — Patient Instructions (Signed)
Call [phone] to schedule an appointment at [facility].

## 2022-01-26 LAB — CBC WITH DIFFERENTIAL/PLATELET
Basophils Absolute: 0.1 10*3/uL (ref 0.0–0.2)
Basos: 1 %
EOS (ABSOLUTE): 0.1 10*3/uL (ref 0.0–0.4)
Eos: 1 %
Hematocrit: 41.3 % (ref 34.0–46.6)
Hemoglobin: 13.8 g/dL (ref 11.1–15.9)
Immature Grans (Abs): 0 10*3/uL (ref 0.0–0.1)
Immature Granulocytes: 0 %
Lymphocytes Absolute: 1.6 10*3/uL (ref 0.7–3.1)
Lymphs: 21 %
MCH: 30.5 pg (ref 26.6–33.0)
MCHC: 33.4 g/dL (ref 31.5–35.7)
MCV: 91 fL (ref 79–97)
Monocytes Absolute: 0.4 10*3/uL (ref 0.1–0.9)
Monocytes: 6 %
Neutrophils Absolute: 5.2 10*3/uL (ref 1.4–7.0)
Neutrophils: 71 %
Platelets: 296 10*3/uL (ref 150–450)
RBC: 4.52 x10E6/uL (ref 3.77–5.28)
RDW: 12.2 % (ref 11.7–15.4)
WBC: 7.3 10*3/uL (ref 3.4–10.8)

## 2022-01-26 LAB — COMPREHENSIVE METABOLIC PANEL
ALT: 10 IU/L (ref 0–32)
AST: 13 IU/L (ref 0–40)
Albumin/Globulin Ratio: 2.1 (ref 1.2–2.2)
Albumin: 5 g/dL — ABNORMAL HIGH (ref 3.8–4.9)
Alkaline Phosphatase: 52 IU/L (ref 44–121)
BUN/Creatinine Ratio: 14 (ref 9–23)
BUN: 11 mg/dL (ref 6–24)
Bilirubin Total: 0.5 mg/dL (ref 0.0–1.2)
CO2: 20 mmol/L (ref 20–29)
Calcium: 9.5 mg/dL (ref 8.7–10.2)
Chloride: 101 mmol/L (ref 96–106)
Creatinine, Ser: 0.81 mg/dL (ref 0.57–1.00)
Globulin, Total: 2.4 g/dL (ref 1.5–4.5)
Glucose: 87 mg/dL (ref 70–99)
Potassium: 4.1 mmol/L (ref 3.5–5.2)
Sodium: 141 mmol/L (ref 134–144)
Total Protein: 7.4 g/dL (ref 6.0–8.5)
eGFR: 87 mL/min/{1.73_m2} (ref >59)

## 2022-01-26 LAB — TSH: TSH: 1.61 u[IU]/mL (ref 0.450–4.500)

## 2022-01-29 ENCOUNTER — Ambulatory Visit (HOSPITAL_BASED_OUTPATIENT_CLINIC_OR_DEPARTMENT_OTHER)
Admission: RE | Admit: 2022-01-29 | Discharge: 2022-01-29 | Disposition: A | Discharge status: Home / Self Care | Payer: 59 | Source: Ambulatory Visit | Attending: Obstetrics & Gynecology | Admitting: Obstetrics & Gynecology

## 2022-01-29 DIAGNOSIS — Z13820 Encounter for screening for osteoporosis: Secondary | ICD-10-CM | POA: Insufficient documentation

## 2022-02-02 ENCOUNTER — Other Ambulatory Visit (HOSPITAL_BASED_OUTPATIENT_CLINIC_OR_DEPARTMENT_OTHER): Payer: Self-pay | Admitting: Obstetrics & Gynecology

## 2022-02-02 DIAGNOSIS — B009 Herpesviral infection, unspecified: Secondary | ICD-10-CM

## 2022-02-06 DIAGNOSIS — A64 Unspecified sexually transmitted disease: Secondary | ICD-10-CM | POA: Insufficient documentation

## 2022-02-07 ENCOUNTER — Encounter: Payer: Self-pay | Admitting: Gastroenterology

## 2022-03-06 ENCOUNTER — Ambulatory Visit (AMBULATORY_SURGERY_CENTER): Payer: 59 | Admitting: *Deleted

## 2022-03-06 VITALS — Ht 64.0 in | Wt 128.0 lb

## 2022-03-06 DIAGNOSIS — Z1211 Encounter for screening for malignant neoplasm of colon: Secondary | ICD-10-CM

## 2022-03-06 MED ORDER — NA SULFATE-K SULFATE-MG SULF 17.5-3.13-1.6 GM/177ML PO SOLN: 1.0000 | Freq: Once | ORAL | 0 refills | Status: AC

## 2022-03-06 NOTE — Progress Notes (Signed)

## 2022-03-12 ENCOUNTER — Other Ambulatory Visit (HOSPITAL_BASED_OUTPATIENT_CLINIC_OR_DEPARTMENT_OTHER): Payer: Self-pay

## 2022-03-12 ENCOUNTER — Ambulatory Visit: Payer: 59 | Admitting: Neurology

## 2022-03-12 ENCOUNTER — Encounter: Payer: Self-pay | Admitting: Neurology

## 2022-03-12 VITALS — BP 126/81 | HR 80 | Ht 64.0 in | Wt 127.5 lb

## 2022-03-12 DIAGNOSIS — G4489 Other headache syndrome: Secondary | ICD-10-CM

## 2022-03-12 DIAGNOSIS — D509 Iron deficiency anemia, unspecified: Secondary | ICD-10-CM

## 2022-03-12 DIAGNOSIS — G4700 Insomnia, unspecified: Secondary | ICD-10-CM | POA: Diagnosis not present

## 2022-03-12 DIAGNOSIS — E559 Vitamin D deficiency, unspecified: Secondary | ICD-10-CM

## 2022-03-12 DIAGNOSIS — Z72820 Sleep deprivation: Secondary | ICD-10-CM

## 2022-03-12 DIAGNOSIS — G4440 Drug-induced headache, not elsewhere classified, not intractable: Secondary | ICD-10-CM

## 2022-03-12 DIAGNOSIS — R519 Headache, unspecified: Secondary | ICD-10-CM | POA: Diagnosis not present

## 2022-03-12 DIAGNOSIS — G43719 Chronic migraine without aura, intractable, without status migrainosus: Secondary | ICD-10-CM | POA: Diagnosis not present

## 2022-03-12 DIAGNOSIS — E538 Deficiency of other specified B group vitamins: Secondary | ICD-10-CM

## 2022-03-12 MED ORDER — AMITRIPTYLINE HCL 25 MG PO TABS
ORAL_TABLET | ORAL | 0 refills | Status: DC
  Filled 2022-03-12: qty 35, 28d supply, fill #0

## 2022-03-12 NOTE — Progress Notes (Signed)
Subjective:    Patient ID: Crystal Macdonald is a 52 y.o. female.  HPI    Star Age, MD, PhD [facility] [address] [place], [place] [address]  Dear Dr. Sabra Heck,   I saw your patient, Crystal Macdonald, upon your kind request in my neurologic clinic today for initial consultation of her right-sided headaches.  The patient is unaccompanied today.  As you know, Crystal Macdonald is a 52 year old female with an underlying medical history of anemia, hyperlipidemia, history of heart murmur, PVCs, iron deficiency anemia, B12 deficiency,, vitamin D deficiency, and migraine headaches, who reports a several year history of recurrent right-sided headaches.  Headaches have been ongoing for at least 6 or 7 years, she had a severe bout in 2017.  Her primary care physician at the time prescribed about 10 pills of Vicodin at the time, she took it 2 or 3 times and it helped, particularly helped her go to sleep which helped the headaches.  She did not end up taking the whole prescription and got rid of the medication.  She has not experienced migraines in her younger years.  Her mom used to have headaches.  She reports no sudden onset of one-sided weakness or numbness or tingling or droopy face or slurring of speech, has noticed an intermittent tremor, particularly in her left hand.  She has had some leg cramps, she admits to having low vitamin B12 and being inconsistent with her oral vitamin B-12.  She had upon last recheck a B12 level of less than 200 but has never taken B12 injections.  She is also supposed to take vitamin D.  She has a history of iron deficiency anemia as well and used to have very heavy.'s but has been postmenopausal since her mid 67s approximately.  She takes [facility] powder for relief of her headaches, this is the only medication that seems to help enough.  Tylenol did not help, ibuprofen did not help either.  She takes BC powder almost daily, usually just 1 a day.  She  drinks no daily caffeine, she has not had any alcohol in a couple of years, she is a non-smoker.  She does not currently work.  She lives with her husband, she has 2 grown daughters, 1 lives in [place] locally, the younger 1 lives in [place].  She tries to hydrate well with water, she does not exercise on a regular basis.  She has a regular eye checkup.  She has contact lenses and goes to the eye doctor once a year.   She does not have significant nausea or vomiting or photophobia with these headaches, they are dull at times and sometimes throbbing.  They are not debilitating but nagging.  She admits to not sleeping well, she often goes to bed with a headache and wakes up tired, she has trouble maintaining sleep for years.  She tried melatonin which did not really help, did not make matters worse either.  She tried trazodone in the past which did help some but caused her GI side effects.  She does sleep with a TV on at night, puts it on a 60-minute timer, present on low volume.  She denies any significant trouble falling asleep.  I reviewed your office note from 01/25/2022.  She had blood work through your office at the time including CBC, CMP and TSH, I reviewed the test results.  Results were benign.  Her Past Medical History Is Significant For: Past Medical History:  Diagnosis Date   Anemia    IDA   Heart murmur    "AS A CHILD OUT GREW IT"   Hyperlipidemia    Insomnia    STD (sexually transmitted disease)    HX HSV II    Her Past Surgical History Is Significant For: Past Surgical History:  Procedure Laterality Date   CESAREAN SECTION  1996   DILATION AND EVACUATION  4235   UMBILICAL HERNIA REPAIR  2006   with mesh--Dr. Deon Pilling    Her Family History Is Significant For: Family History  Problem Relation Age of Onset   Hypertension Mother    Headache Mother    Hypertension Father    AAA (abdominal aortic aneurysm) Father    Sleep apnea Father    Heart attack Maternal Aunt     Heart attack Maternal Aunt    Heart attack Maternal Uncle        deceased age 84   Heart attack Maternal Uncle    Heart attack Maternal Grandmother    Heart attack Maternal Grandfather    Diabetes Paternal Grandmother        adult onset   Stroke Paternal Grandmother        deceased   Stroke Paternal Grandfather        deceased   Breast cancer Neg Hx    Colon cancer Neg Hx    Colon polyps Neg Hx    Crohn's disease Neg Hx    Esophageal cancer Neg Hx    Rectal cancer Neg Hx    Stomach cancer Neg Hx    Ulcerative colitis Neg Hx     Her Social History Is Significant For: Social History   Socioeconomic History   Marital status: Married    Spouse name: Not on file   Number of children: Not on file   Years of education: Not on file   Highest education level: Not on file  Occupational History   Not on file  Tobacco Use   Smoking status: Never   Smokeless tobacco: Never  Vaping Use   Vaping Use: Never used  Substance and Sexual Activity   Alcohol use: No    Alcohol/week: 0.0 standard drinks of alcohol   Drug use: No   Sexual activity: Yes    Partners: Male    Birth control/protection: Post-menopausal  Other Topics Concern   Not on file  Social History Narrative   Not on file   Social Determinants of Health   Financial Resource Strain: Not on file  Food Insecurity: Not on file  Transportation Needs: Not on file  Physical Activity: Not on file  Stress: Not on file  Social Connections: Not on file    Her Allergies Are:  No Known Allergies:   Her Current Medications Are:  Outpatient Encounter Medications as of 03/12/2022  Medication Sig   amitriptyline (ELAVIL) 25 MG tablet Take 2 tablets (50 mg total) by mouth at bedtime. 1/2 pill each bedtime x 1 week, then 1 pill nightly x 1 week, then 1 1/2 pills nightly x 1 week, then 2 pills nightly thereafter.   Aspirin-Salicylamide-Caffeine (BC HEADACHE POWDER PO) Take by mouth as needed.   estradiol (VIVELLE-DOT) 0.05  MG/24HR patch Place 1 patch (0.05 mg total) onto the skin 2 (two) times a week.   norethindrone (AYGESTIN) 5 MG tablet Take 0.5 tablets (2.5 mg total) by mouth daily.   valACYclovir (VALTREX) 500 MG tablet TAKE 1 TABLET BY MOUTH TWICE A DAY X 3 DAYS AS  NEEDED   vitamin B-12 (CYANOCOBALAMIN) 500 MCG tablet Take 500 mcg by mouth daily.   VITAMIN D PO Take 1,000 Units/day by mouth. Twice weekly   ibuprofen (ADVIL,MOTRIN) 200 MG tablet Take 200 mg by mouth every 6 (six) hours as needed. (Patient not taking: Reported on 03/12/2022)   No facility-administered encounter medications on file as of 03/12/2022.  :   Review of Systems:  Out of a complete 14 point review of systems, all are reviewed and negative with the exception of these symptoms as listed below:  Review of Systems  Neurological:        Pt here for headaches . Pt states headache pain on right side of head. Pt states some neck stiffness   Pt states daily headaches,fatigue.     Objective:  Neurological Exam  Physical Exam Physical Examination:   Vitals:   03/12/22 0815  BP: 126/81  Pulse: 80    General Examination: The patient is a very pleasant 52 y.o. female in no acute distress. She appears well-developed and well-nourished and well groomed.   HEENT: Normocephalic, atraumatic, pupils are equal, round and reactive to light, no photophobia.  Funduscopic exam benign.  Mildly dry eyes.  Contact lenses in place.  Extraocular tracking is good without limitation to gaze excursion or nystagmus noted. Hearing is grossly intact. Face is symmetric with normal facial animation. Speech is clear with no dysarthria noted. There is no hypophonia. There is no lip, neck/head, jaw or voice tremor. Neck is supple with full range of passive and active motion. There are no carotid bruits on auscultation.  No trouble moving her tongue, no dyskinesias of the mouth.  No twitching around lips.  Chest: Clear to auscultation without wheezing, rhonchi or  crackles noted.  Heart: S1+S2+0, regular and normal without murmurs, rubs or gallops noted.   Abdomen: Soft, non-tender and non-distended.  Extremities: There is no pitting edema in the distal lower extremities bilaterally.  Pedal pulses intact.  Skin: Warm and dry without trophic changes noted.   Musculoskeletal: exam reveals no obvious joint deformities.   Neurologically:  Mental status: The patient is awake, alert and oriented in all 4 spheres. Her immediate and remote memory, attention, language skills and fund of knowledge are appropriate. There is no evidence of aphasia, agnosia, apraxia or anomia. Speech is clear with normal prosody and enunciation. Thought process is linear. Mood is normal and affect is normal.  Cranial nerves II - XII are as described above under HEENT exam.  Motor exam: Normal bulk, strength and tone is noted. There is no obvious action or resting tremor.  No significant postural or intention tremor.  Reflexes 2+, toes are downgoing bilaterally. Fine motor skills and coordination: Intact finger taps, hand movements and rapid alternating patting in both upper extremities, intact foot taps bilaterally. Cerebellar testing: No dysmetria or intention tremor. There is no truncal or gait ataxia.  Normal heel-to-shin, normal finger-to-nose bilaterally. Sensory exam: intact to light touch in the upper and lower extremities.  Gait, station and balance: She stands easily. No veering to one side is noted. No leaning to one side is noted. Posture is age-appropriate and stance is narrow based. Gait shows normal stride length and normal pace. No problems turning are noted.  Romberg negative.  Tandem walk normal.  Assessment and Plan:   In summary, Krystyn Macdonald Mulroy is a very pleasant 52 y.o.-year old female with an underlying medical history of anemia, hyperlipidemia, history of heart murmur, PVCs, iron deficiency anemia, B12  deficiency,, vitamin D deficiency, and migraine  headaches, who presents for evaluation of her recurrent headaches of several years duration.  She has mostly right-sided headaches, description not classic for migraines, could be a combination headache including tension headache, sleep deprivation, migraine headaches, also medication related headaches from taking BC powder almost daily.  We talked about headache triggers, we talked about sleep as a potential factor and she is advised to phase out of taking BC powder daily.  She is advised to come off of it gradually and stay off of the [facility] powder for now.  Exam is nonfocal, she has regular eye examinations, no alarm balance on history or exam.  Nevertheless, I would like to proceed with a brain MRI with and without contrast to 2 mostly right-sided headaches.  I would like to rule out a structural cause.  In addition, we talked about certain other symptoms including muscle cramps, as well as fatigue and intermittent tremors.  There could be several factors at play.  She has a history of anemia, iron deficiency, B12 deficiency and vitamin D deficiency.  I would like to recheck some of these labs.  She may benefit from B12 injections if her B12 level is still low, in the 200 range or below.  She is advised to stay well-hydrated with water, I would like to start her on amitriptyline for headache prevention, we will start with 25 mg strength half a pill at night and gradually build up to up to 50 mg at night.  We talked about common side effects including mouth dryness, eye dryness, sleepiness, it may be helpful for her sleep in fact.  She is agreeable to this plan, we will call her with her blood test results or email her on MyChart if results are benign.  In addition, we will let her know what her brain MRI showed in the interim, we will plan a follow-up in this clinic in about 3 months, sooner if needed.  I answered all her questions today and she was in agreement.  Thank you very much for allowing me to participate  in the care of this nice patient. If I can be of any further assistance to you please do not hesitate to call me at [phone].  Sincerely,   Star Age, MD, PhD

## 2022-03-12 NOTE — Patient Instructions (Addendum)
It was nice to meet you today.  Your headaches may be from a combination of factors, including poor sleep or sleep deprivation, regular use of BC powder, stress, migraines.   For headache prevention, I would like for you to start a medication called Elavil (generic name: amitriptyline) 25 mg: Take half a pill daily at bedtime for one week, then one pill daily at bedtime for one week, then one and a half pills daily at bedtime for one week, then 2 pills daily at bedtime thereafter. Common side effects reported are: mouth dryness, drowsiness, confusion, dizziness.  You can stay on the lower dose such as 1 pill or 1-1/2 pills or even half a pill daily if you feel it is effective.  It may help you attain more consolidated sleep as it does make some people drowsy.  Please phase out of taking BC powder on a regular basis, this is a medication that can perpetuate headaches, causing overuse headaches or withdrawal headaches.   Please remember, common headache triggers are: sleep deprivation, dehydration, overheating, stress, hypoglycemia or skipping meals and blood sugar fluctuations, excessive pain medications or excessive alcohol use or caffeine withdrawal. Some people have food triggers such as aged cheese, orange juice or chocolate, especially dark chocolate, or MSG (monosodium glutamate). Try to avoid these headache triggers as much possible. It may be helpful to keep a headache diary to figure out what makes your headaches worse or brings them on and what alleviates them. Some people report headache onset after exercise but studies have shown that regular exercise may actually prevent headaches from coming. If you have exercise-induced headaches, please make sure that you drink plenty of fluid before and after exercising and that you do not over do it and do not overheat.  We will do a brain scan, called MRI and call you with the test results. We will have to schedule you for this on a separate date. This test  requires authorization from your insurance, and we will take care of the insurance process.  We will check blood work today and call you with the test results.

## 2022-03-13 ENCOUNTER — Telehealth: Payer: Self-pay | Admitting: Neurology

## 2022-03-13 LAB — VITAMIN D 25 HYDROXY (VIT D DEFICIENCY, FRACTURES): Vit D, 25-Hydroxy: 26.4 ng/mL — ABNORMAL LOW (ref 30.0–100.0)

## 2022-03-13 LAB — B12 AND FOLATE PANEL
Folate: 6.7 ng/mL (ref >3.0)
Vitamin B-12: 724 pg/mL (ref 232–1245)

## 2022-03-13 LAB — IRON AND TIBC
Iron Saturation: 28 % (ref 15–55)
Iron: 108 ug/dL (ref 27–159)
Total Iron Binding Capacity: 383 ug/dL (ref 250–450)
UIBC: 275 ug/dL (ref 131–425)

## 2022-03-13 LAB — FERRITIN: Ferritin: 54 ng/mL (ref 15–150)

## 2022-03-13 NOTE — Telephone Encounter (Signed)
Pt scheduled for MR brain w/wo contrast at [facility] for 03/20/22 at [facility] case #[id number]

## 2022-03-13 NOTE — Telephone Encounter (Signed)
Pt is returning a call to schedule MRI.

## 2022-03-13 NOTE — Telephone Encounter (Signed)
Pt scheduled for MRI at [facility] for 03/20/22 at 9:00am

## 2022-03-14 ENCOUNTER — Ambulatory Visit: Payer: 59

## 2022-03-14 DIAGNOSIS — G4489 Other headache syndrome: Secondary | ICD-10-CM | POA: Diagnosis not present

## 2022-03-14 MED ORDER — GADOBENATE DIMEGLUMINE 529 MG/ML IV SOLN
10.0000 mL | Freq: Once | INTRAVENOUS | Status: AC | PRN
  Administered 2022-03-14: 10 mL via INTRAVENOUS

## 2022-03-19 ENCOUNTER — Encounter: Payer: Self-pay | Admitting: Gastroenterology

## 2022-03-20 ENCOUNTER — Other Ambulatory Visit: Payer: 59

## 2022-03-21 ENCOUNTER — Encounter (HOSPITAL_BASED_OUTPATIENT_CLINIC_OR_DEPARTMENT_OTHER): Payer: Self-pay | Admitting: Obstetrics & Gynecology

## 2022-03-27 ENCOUNTER — Encounter: Payer: Self-pay | Admitting: Gastroenterology

## 2022-03-27 ENCOUNTER — Ambulatory Visit (AMBULATORY_SURGERY_CENTER): Payer: 59 | Admitting: Gastroenterology

## 2022-03-27 VITALS — BP 116/69 | HR 90 | Temp 98.9°F | Resp 14 | Ht 63.5 in | Wt 128.0 lb

## 2022-03-27 DIAGNOSIS — Z1211 Encounter for screening for malignant neoplasm of colon: Secondary | ICD-10-CM

## 2022-03-27 DIAGNOSIS — D120 Benign neoplasm of cecum: Secondary | ICD-10-CM

## 2022-03-27 MED ORDER — SODIUM CHLORIDE 0.9 % IV SOLN: 500.0000 mL | INTRAVENOUS | Status: DC

## 2022-03-27 NOTE — Progress Notes (Signed)
A and O x3. Report to RN. Tolerated MAC anesthesia well. 

## 2022-03-27 NOTE — Op Note (Signed)
[place] Patient Name: Crystal Macdonald Procedure Date: 03/27/2022 11:07 AM MRN: [id number] Endoscopist: Thornton Park MD, MD, [id number] Age: 52 Referring MD:  Date of Birth: 06/10/1970 Gender: Female Account #: [id number] Procedure:                Colonoscopy Indications:              Screening for colorectal malignant neoplasm, This                            is the patient's first colonoscopy                           No known family history of colon cancer or polyps Medicines:                Monitored Anesthesia Care Procedure:                Pre-Anesthesia Assessment:                           - Prior to the procedure, a History and Physical                            was performed, and patient medications and                            allergies were reviewed. The patient's tolerance of                            previous anesthesia was also reviewed. The risks                            and benefits of the procedure and the sedation                            options and risks were discussed with the patient.                            All questions were answered, and informed consent                            was obtained. Prior Anticoagulants: The patient has                            taken no anticoagulant or antiplatelet agents. ASA                            Grade Assessment: II - A patient with mild systemic                            disease. After reviewing the risks and benefits,                            the patient was deemed in satisfactory condition to  undergo the procedure.                           After obtaining informed consent, the colonoscope                            was passed under direct vision. Throughout the                            procedure, the patient's blood pressure, pulse, and                            oxygen saturations were monitored continuously. The                            Olympus PCF-H190DL  [id number]) Colonoscope was                            introduced through the anus and advanced to the 3                            cm into the ileum. A second forward view of the                            right colon was performed. The colonoscopy was                            performed without difficulty. The patient tolerated                            the procedure well. The quality of the bowel                            preparation was good. The terminal ileum, ileocecal                            valve, appendiceal orifice, and rectum were                            photographed. Scope In: 11:23:03 AM Scope Out: 11:34:43 AM Scope Withdrawal Time: 0 hours 7 minutes 59 seconds  Total Procedure Duration: 0 hours 11 minutes 40 seconds  Findings:                 The perianal and digital rectal examinations were                            normal.                           A 5 mm polyp was found in the cecum. The polyp was                            flat. The polyp was removed with a cold snare.  Resection and retrieval were complete. Estimated                            blood loss was minimal.                           The exam was otherwise without abnormality on                            direct and retroflexion views. Complications:            No immediate complications. Estimated Blood Loss:     Estimated blood loss was minimal. Impression:               - One 5 mm polyp in the cecum, removed with a cold                            snare. Resected and retrieved.                           - The examination was otherwise normal on direct                            and retroflexion views. Recommendation:           - Patient has a contact number available for                            emergencies. The signs and symptoms of potential                            delayed complications were discussed with the                            patient. Return to normal  activities tomorrow.                            Written discharge instructions were provided to the                            patient.                           - Resume previous diet.                           - Continue present medications.                           - Await pathology results.                           - Repeat colonoscopy date to be determined after                            pending pathology results are reviewed for  surveillance.                           - Emerging evidence supports eating a diet of                            fruits, vegetables, grains, calcium, and yogurt                            while reducing red meat and alcohol may reduce the                            risk of colon cancer.                           - Thank you for allowing me to be involved in your                            colon cancer prevention. Thornton Park MD, MD 03/27/2022 11:39:44 AM This report has been signed electronically.

## 2022-03-27 NOTE — Patient Instructions (Signed)
Discharge instructions given. Handout on polyps. Resume previous medications. YOU HAD AN ENDOSCOPIC PROCEDURE TODAY AT THE [facility]:   Refer to the procedure report that was given to you for any specific questions about what was found during the examination.  If the procedure report does not answer your questions, please call your gastroenterologist to clarify.  If you requested that your care partner not be given the details of your procedure findings, then the procedure report has been included in a sealed envelope for you to review at your convenience later.  YOU SHOULD EXPECT: Some feelings of bloating in the abdomen. Passage of more gas than usual.  Walking can help get rid of the air that was put into your GI tract during the procedure and reduce the bloating. If you had a lower endoscopy (such as a colonoscopy or flexible sigmoidoscopy) you may notice spotting of blood in your stool or on the toilet paper. If you underwent a bowel prep for your procedure, you may not have a normal bowel movement for a few days.  Please Note:  You might notice some irritation and congestion in your nose or some drainage.  This is from the oxygen used during your procedure.  There is no need for concern and it should clear up in a day or so.  SYMPTOMS TO REPORT IMMEDIATELY:  Following lower endoscopy (colonoscopy or flexible sigmoidoscopy):  Excessive amounts of blood in the stool  Significant tenderness or worsening of abdominal pains  Swelling of the abdomen that is new, acute  Fever of 100F or higher   For urgent or emergent issues, a gastroenterologist can be reached at any hour by calling [phone]. Do not use MyChart messaging for urgent concerns.    DIET:  We do recommend a small meal at first, but then you may proceed to your regular diet.  Drink plenty of fluids but you should avoid alcoholic beverages for 24 hours.  ACTIVITY:  You should plan to take it easy for the rest  of today and you should NOT DRIVE or use heavy machinery until tomorrow (because of the sedation medicines used during the test).    FOLLOW UP: Our staff will call the number listed on your records the next business day following your procedure.  We will call around 7:15- 8:00 am to check on you and address any questions or concerns that you may have regarding the information given to you following your procedure. If we do not reach you, we will leave a message.     If any biopsies were taken you will be contacted by phone or by letter within the next 1-3 weeks.  Please call us at [phone] if you have not heard about the biopsies in 3 weeks.    SIGNATURES/CONFIDENTIALITY: You and/or your care partner have signed paperwork which will be entered into your electronic medical record.  These signatures attest to the fact that that the information above on your After Visit Summary has been reviewed and is understood.  Full responsibility of the confidentiality of this discharge information lies with you and/or your care-partner. 

## 2022-03-27 NOTE — Progress Notes (Signed)
Called to room to assist during endoscopic procedure.  Patient ID and intended procedure confirmed with present staff. Received instructions for my participation in the procedure from the performing physician.  

## 2022-03-27 NOTE — Progress Notes (Signed)
Referring Provider: Megan Salon, MD Primary Care Physician:  Antony Contras, MD   Indication for Colonoscopy:  Colon cancer screening   IMPRESSION:  Need for colon cancer screening Appropriate candidate for monitored anesthesia care  PLAN: Colonoscopy in the [facility] today   HPI: Crystal Macdonald is a 52 y.o. female presents for screening colonoscopy.  No prior colonoscopy or colon cancer screening.  No known family history of colon cancer or polyps. No family history of uterine/endometrial cancer, pancreatic cancer or gastric/stomach cancer.   Past Medical History:  Diagnosis Date   Anemia    IDA   Heart murmur    "AS A CHILD OUT GREW IT"   Hyperlipidemia    Insomnia    STD (sexually transmitted disease)    HX HSV II    Past Surgical History:  Procedure Laterality Date   CESAREAN SECTION  1996   DILATION AND EVACUATION  5093   UMBILICAL HERNIA REPAIR  2006   with mesh--Dr. Deon Pilling    Current Outpatient Medications  Medication Sig Dispense Refill   estradiol (VIVELLE-DOT) 0.05 MG/24HR patch Place 1 patch (0.05 mg total) onto the skin 2 (two) times a week. 24 patch 3   norethindrone (AYGESTIN) 5 MG tablet Take 0.5 tablets (2.5 mg total) by mouth daily. 90 tablet 3   VITAMIN D PO Take 1,000 Units/day by mouth. Twice weekly     amitriptyline (ELAVIL) 25 MG tablet Take 0.5 tablets (12.5 mg total) by mouth at bedtime for 7 days, THEN 1 tablet (25 mg total) at bedtime for 7 days, THEN 1.5 tablets (37.5 mg total) at bedtime for 7 days, THEN 2 tablets (50 mg total) at bedtime. 35 tablet 0   Aspirin-Salicylamide-Caffeine (BC HEADACHE POWDER PO) Take by mouth as needed.     ibuprofen (ADVIL,MOTRIN) 200 MG tablet Take 200 mg by mouth every 6 (six) hours as needed. (Patient not taking: Reported on 03/12/2022)     valACYclovir (VALTREX) 500 MG tablet TAKE 1 TABLET BY MOUTH TWICE A DAY X 3 DAYS AS NEEDED 180 tablet 1   vitamin B-12 (CYANOCOBALAMIN) 500 MCG tablet Take 500 mcg by  mouth daily.     Current Facility-Administered Medications  Medication Dose Route Frequency Provider Last Rate Last Admin   0.9 %  sodium chloride infusion  500 mL Intravenous Continuous Thornton Park, MD        Allergies as of 03/27/2022   (No Known Allergies)    Family History  Problem Relation Age of Onset   Hypertension Mother    Headache Mother    Hypertension Father    AAA (abdominal aortic aneurysm) Father    Sleep apnea Father    Heart attack Maternal Aunt    Heart attack Maternal Aunt    Heart attack Maternal Uncle        deceased age 40   Heart attack Maternal Uncle    Heart attack Maternal Grandmother    Heart attack Maternal Grandfather    Diabetes Paternal Grandmother        adult onset   Stroke Paternal Grandmother        deceased   Stroke Paternal Grandfather        deceased   Breast cancer Neg Hx    Colon cancer Neg Hx    Colon polyps Neg Hx    Crohn's disease Neg Hx    Esophageal cancer Neg Hx    Rectal cancer Neg Hx    Stomach cancer Neg Hx  Ulcerative colitis Neg Hx      Physical Exam: General:   Alert,  well-nourished, pleasant and cooperative in NAD Head:  Normocephalic and atraumatic. Eyes:  Sclera clear, no icterus.   Conjunctiva pink. Mouth:  No deformity or lesions.   Neck:  Supple; no masses or thyromegaly. Lungs:  Clear throughout to auscultation.   No wheezes. Heart:  Regular rate and rhythm; no murmurs. Abdomen:  Soft, non-tender, nondistended, normal bowel sounds, no rebound or guarding.  Msk:  Symmetrical. No boney deformities LAD: No inguinal or umbilical LAD Extremities:  No clubbing or edema. Neurologic:  Alert and  oriented x4;  grossly nonfocal Skin:  No obvious rash or bruise. Psych:  Alert and cooperative. Normal mood and affect.     Studies/Results: No results found.    Tylor Courtwright L. Tarri Glenn, MD, MPH 03/27/2022, 11:15 AM

## 2022-03-27 NOTE — Progress Notes (Signed)
Pt's states no medical or surgical changes since previsit or office visit. 

## 2022-03-28 ENCOUNTER — Telehealth: Payer: Self-pay

## 2022-03-28 NOTE — Telephone Encounter (Signed)
  Follow up Call-     03/27/2022   10:56 AM  Call back number  Post procedure Call Back phone  # [phone]  Permission to leave phone message Yes     Patient questions:  Do you have a fever, pain , or abdominal swelling? No. Pain Score  0 *  Have you tolerated food without any problems? Yes.    Have you been able to return to your normal activities? Yes.    Do you have any questions about your discharge instructions: Diet   No. Medications  No. Follow up visit  No.  Do you have questions or concerns about your Care? No.  Actions: * If pain score is 4 or above: No action needed, pain <4.

## 2022-03-29 ENCOUNTER — Encounter: Payer: Self-pay | Admitting: Gastroenterology

## 2022-04-30 ENCOUNTER — Other Ambulatory Visit: Payer: Self-pay | Admitting: Neurology

## 2022-05-02 ENCOUNTER — Other Ambulatory Visit (HOSPITAL_BASED_OUTPATIENT_CLINIC_OR_DEPARTMENT_OTHER): Payer: Self-pay

## 2022-05-02 MED ORDER — AMITRIPTYLINE HCL 25 MG PO TABS
25.0000 mg | ORAL_TABLET | Freq: Every day | ORAL | 3 refills | Status: DC
  Filled 2022-05-02: qty 30, 30d supply, fill #0
  Filled 2022-06-03: qty 30, 30d supply, fill #1
  Filled 2022-07-02: qty 30, 30d supply, fill #2

## 2022-05-02 NOTE — Telephone Encounter (Signed)
Rx for amitriptyline changed to 25 mg nightly.

## 2022-05-09 ENCOUNTER — Other Ambulatory Visit: Payer: Self-pay | Admitting: Family Medicine

## 2022-05-09 DIAGNOSIS — E7800 Pure hypercholesterolemia, unspecified: Secondary | ICD-10-CM

## 2022-05-10 IMAGING — MG MM DIGITAL SCREENING BILAT W/ TOMO AND CAD
8 series · 9 of 24 positions shown · non-contrast
Comparison: Previous exam(s).

CLINICAL DATA: Screening.

EXAM:
DIGITAL SCREENING BILATERAL MAMMOGRAM WITH TOMOSYNTHESIS AND CAD
TECHNIQUE: Bilateral screening digital craniocaudal and mediolateral oblique
mammograms were obtained. Bilateral screening digital breast
tomosynthesis was performed. The images were evaluated with
computer-aided detection.

[R MLO synth-2D]
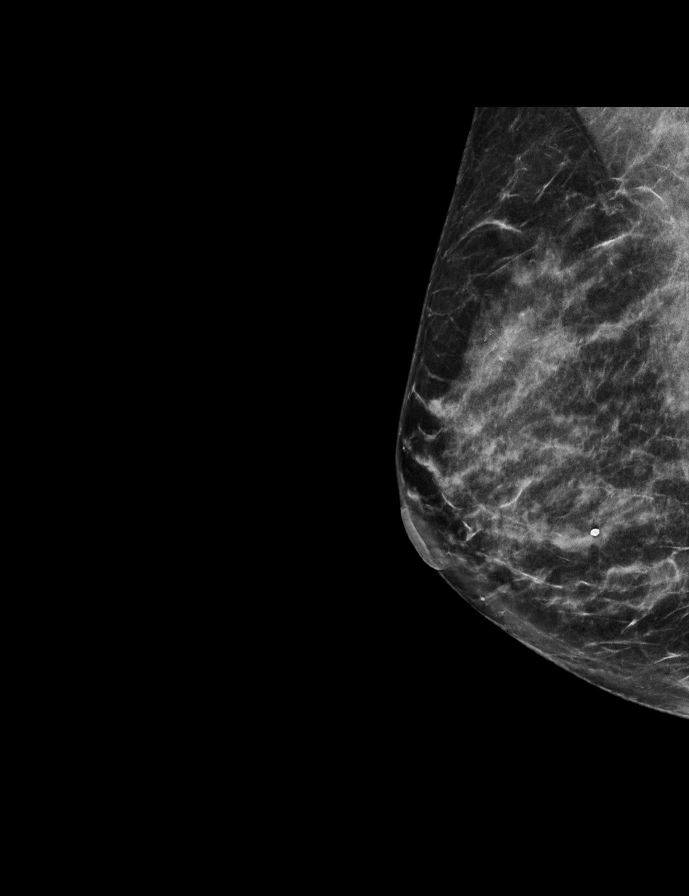

[R CC synth-2D]
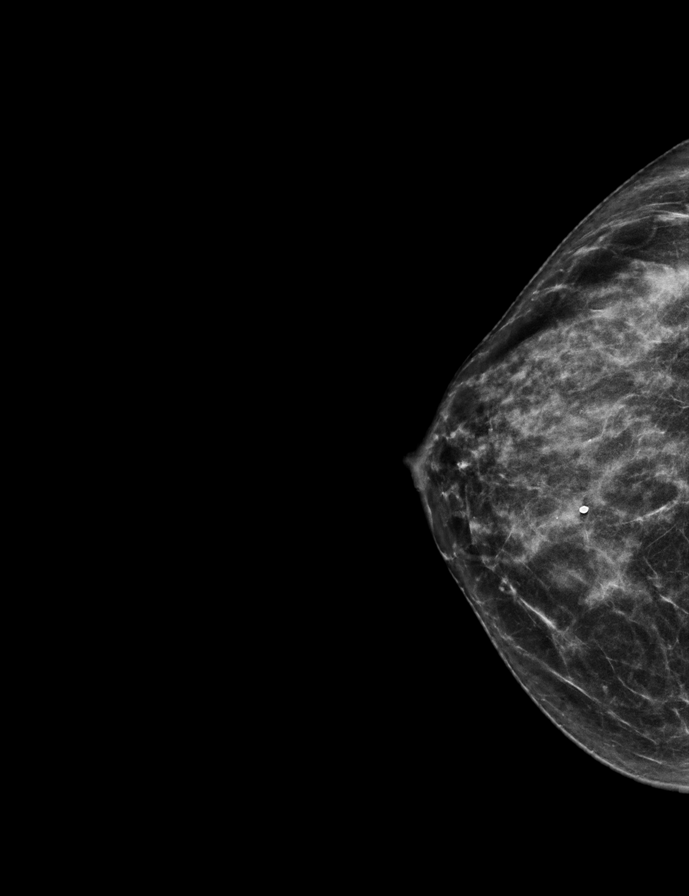

[L MLO synth-2D]
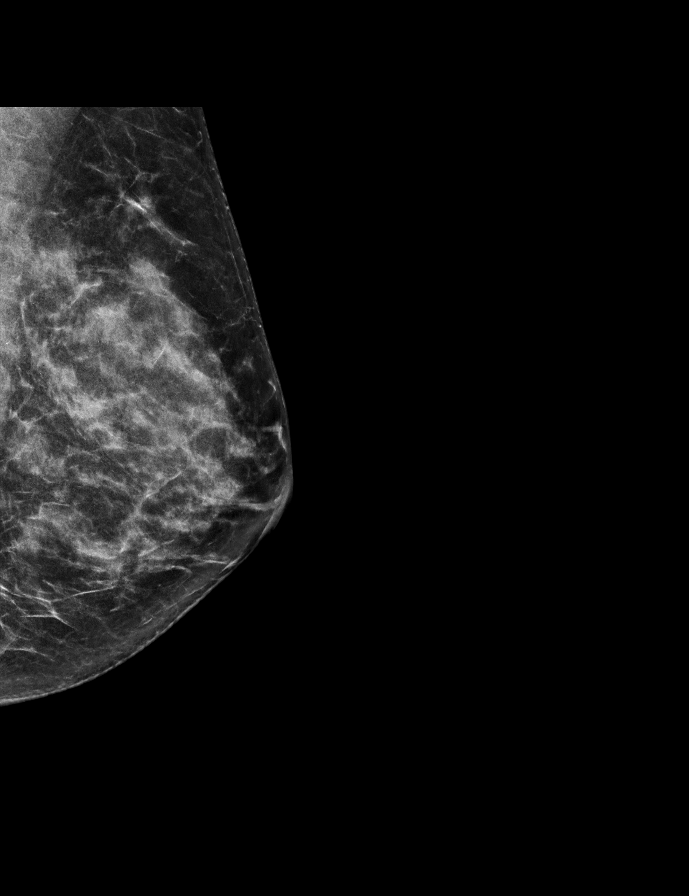

[L CC synth-2D]
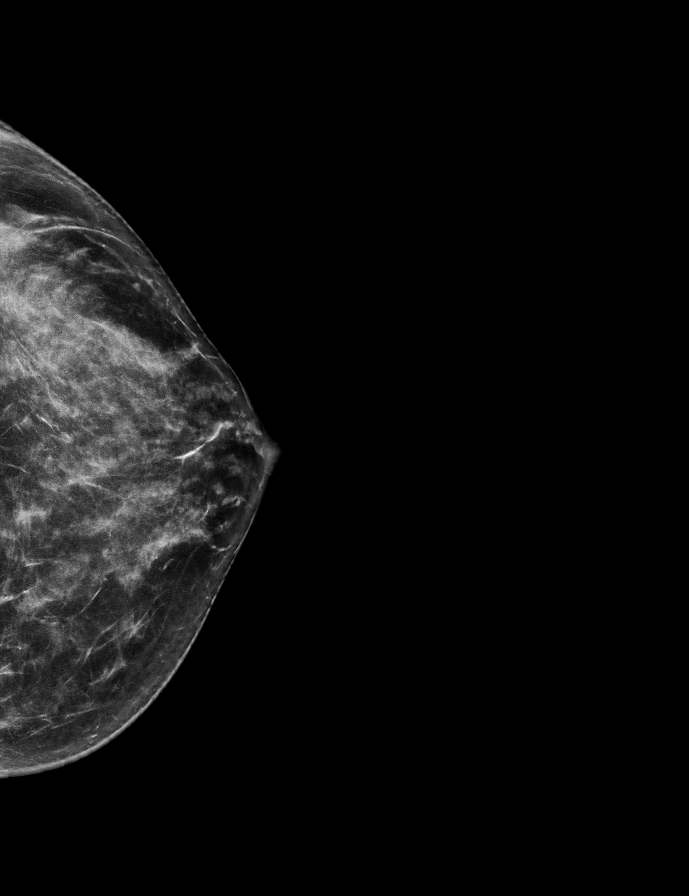

[R CC tomo · 2 of 56 frames shown]
[frame 19/56]
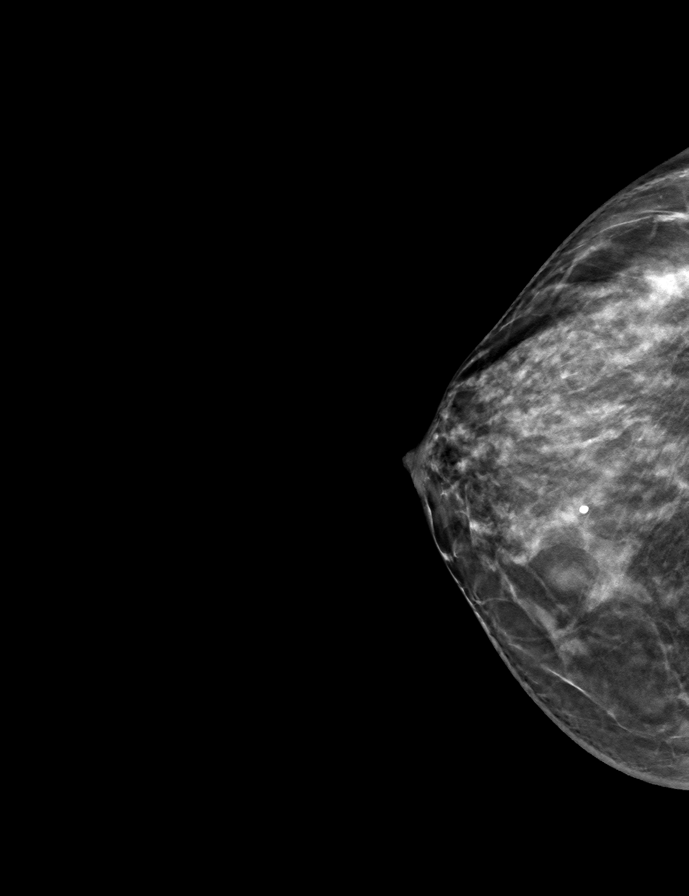
[frame 29/56]
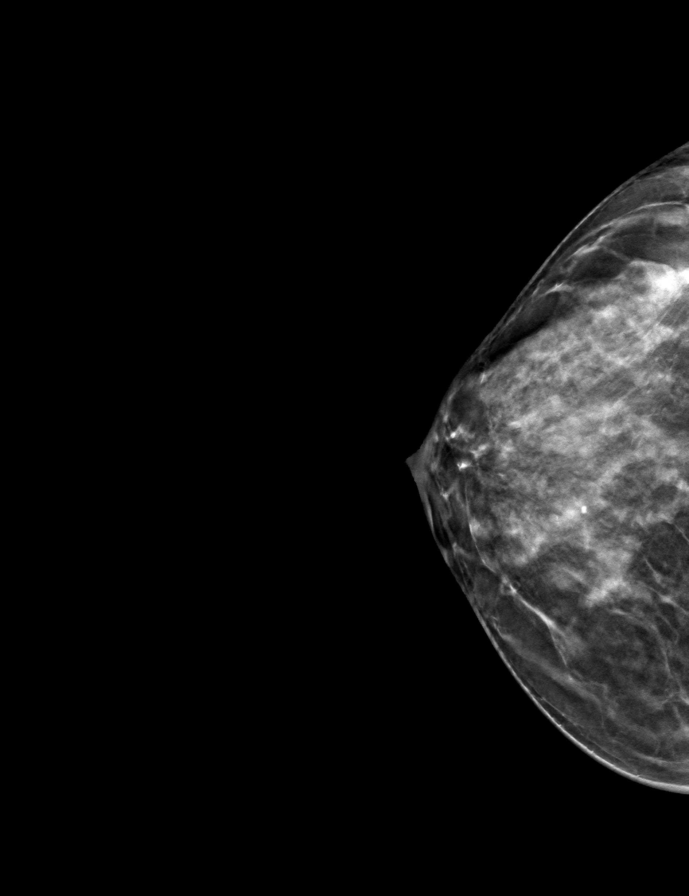

[L CC tomo · tomo slice 31/60.0]
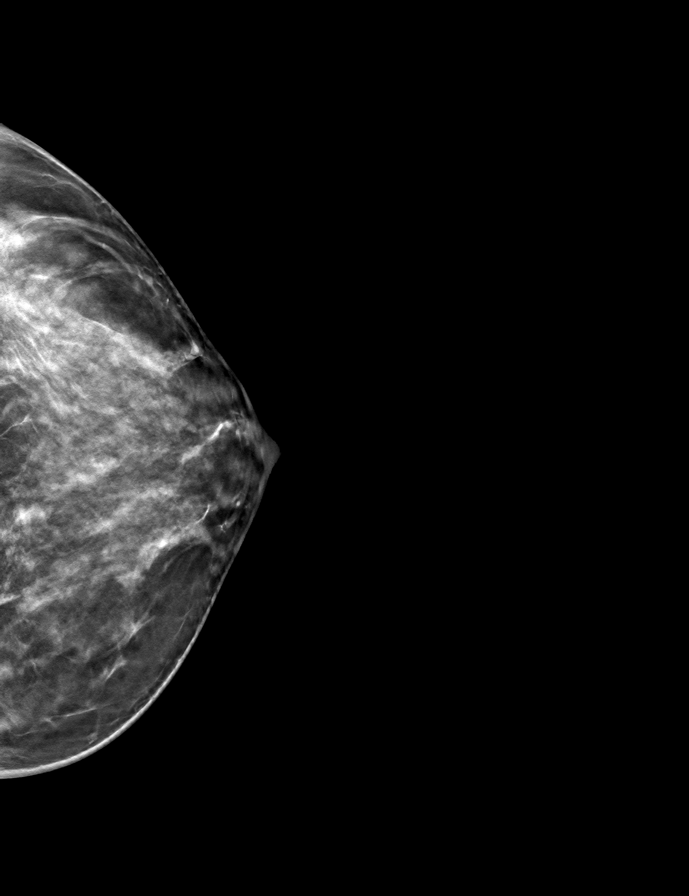

[R MLO tomo · tomo slice 29/56.0]
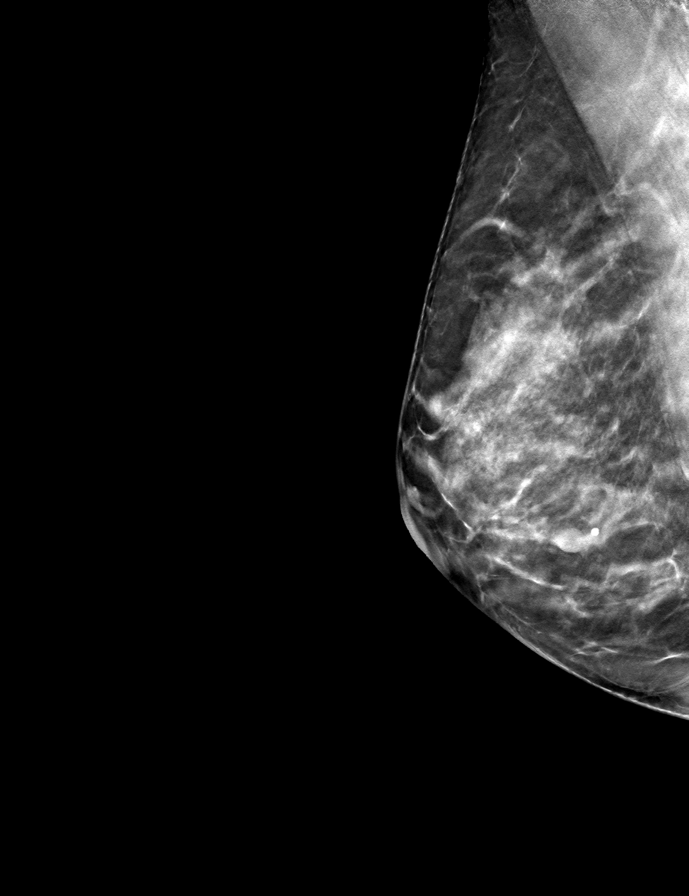

[L MLO tomo · tomo slice 31/60.0]
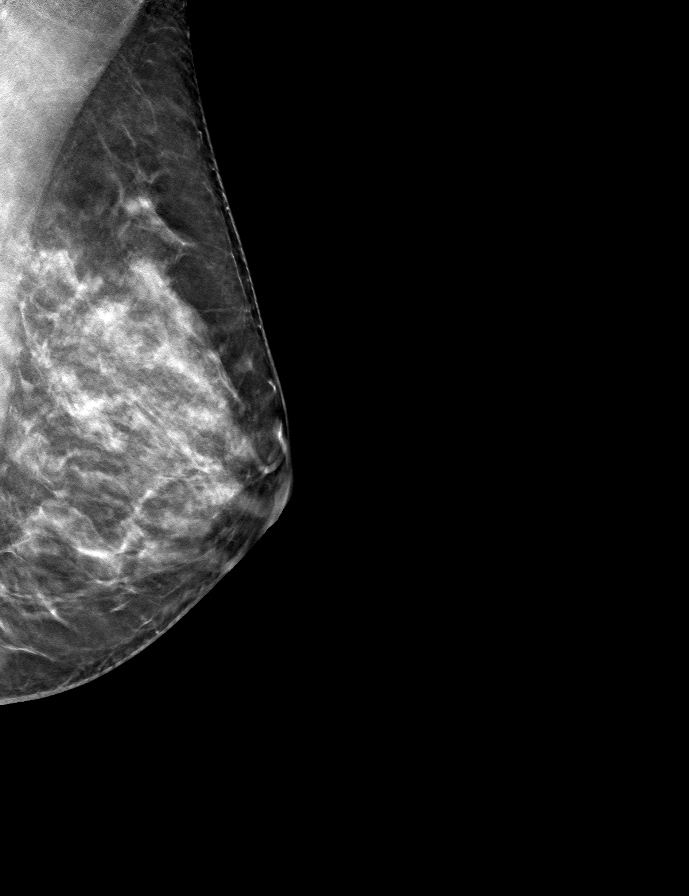

[9 of 24 positions shown; findings below may reference images not displayed]

ACR Breast Density Category c: The breast tissue is heterogeneously
dense, which may obscure small masses.
FINDINGS: There are no findings suspicious for malignancy.
IMPRESSION: No mammographic evidence of malignancy. A result letter of this
screening mammogram will be mailed directly to the patient.

RECOMMENDATION:
Screening mammogram in one year. (Code:Q3-W-BC3)

BI-RADS CATEGORY  1: Negative.

## 2022-06-07 ENCOUNTER — Ambulatory Visit
Admission: RE | Admit: 2022-06-07 | Discharge: 2022-06-07 | Disposition: A | Discharge status: Home / Self Care | Payer: 59 | Source: Ambulatory Visit | Attending: Family Medicine | Admitting: Family Medicine

## 2022-06-07 DIAGNOSIS — E7800 Pure hypercholesterolemia, unspecified: Secondary | ICD-10-CM

## 2022-06-11 ENCOUNTER — Encounter: Payer: Self-pay | Admitting: Neurology

## 2022-06-11 ENCOUNTER — Ambulatory Visit: Payer: 59 | Admitting: Neurology

## 2022-06-11 VITALS — BP 143/79 | HR 104 | Ht 64.0 in | Wt 137.0 lb

## 2022-06-11 DIAGNOSIS — R519 Headache, unspecified: Secondary | ICD-10-CM

## 2022-06-11 NOTE — Progress Notes (Signed)
Subjective:    Patient ID: Crystal Macdonald is a 52 y.o. female.  HPI    Interim history:   Crystal Macdonald is a 52 year old female with an underlying medical history of anemia, hyperlipidemia, history of heart murmur, PVCs, iron deficiency anemia, B12 deficiency,, vitamin D deficiency, and migraine headaches, who presents for follow-up consultation of her recurrent headaches.  The patient is unaccompanied today.  I first met her at the request of her OB/GYN on 03/12/2022, at which time she reported a several year history of recurrent right-sided headaches.  She reported a history of vitamin D and vitamin B12 deficiencies.  She was taking nearly daily BC powder.  Her exam was nonfocal.  She was advised to stop taking BC powder daily.  I suggest that she start amitriptyline for headache prevention.  She was advised to stay well-hydrated and we checked some labs including vitamin D level and vitamin B12.  We also checked iron studies.  Her vitamin B12 was in the normal range at 724, folate 6.7, vitamin D mildly below normal at 26.4.  She was advised to continue with vitamin D supplementation and get a recheck done through PCP in about 3 to 4 months.  Given her mostly one-sided headaches, I suggested we proceed with a brain MRI.  She had a brain MRI with and without contrast on 03/14/2022 and I reviewed the results:  IMPRESSION: Normal MRI scan of the brain with and without contrast.   I personally reviewed the images through the PACS system.  She was advised of her test results via MyChart.  Today, 06/11/2022: She reports that her headaches are better.  She is currently taking 25 mg of amitriptyline at bedtime.  She feels that some of her headaches are related to not sleeping well.  She had tried melatonin in the past which did not help and other over-the-counter sleep aids made her groggy the next day even at half a dose.  She is sensitive to medications.  When she went up on the amitriptyline to 1-1/2 pills  she noticed some side effects and has been taking only 25 mg at bedtime.  It does help her sleep a little better but she has gained some weight and she would like to see if she can come off of the medication.  She is not aware of any significant snoring, her dad has sleep apnea.  She has a family history of stroke and heart attack and early heart disease and hyperlipidemia, she herself has significant hyperlipidemia but has never been on a statin, she has been reluctant but her latest LDL was around 185.  She hydrates well with water.  She has had occasional morning headaches, no nightly nocturia.  Her eye exam is up-to-date.  She is a light sleeper.  Limited caffeine intake.  The patient's allergies, current medications, family history, past medical history, past social history, past surgical history and problem list were reviewed and updated as appropriate.   Previously:   03/12/22: (She) reports a several year history of recurrent right-sided headaches.  Headaches have been ongoing for at least 6 or 7 years, she had a severe bout in 2017.  Her primary care physician at the time prescribed about 10 pills of Vicodin at the time, she took it 2 or 3 times and it helped, particularly helped her go to sleep which helped the headaches.  She did not end up taking the whole prescription and got rid of the medication.  She has not experienced  migraines in her younger years.  Her mom used to have headaches.  She reports no sudden onset of one-sided weakness or numbness or tingling or droopy face or slurring of speech, has noticed an intermittent tremor, particularly in her left hand.  She has had some leg cramps, she admits to having low vitamin B12 and being inconsistent with her oral vitamin B-12.  She had upon last recheck a B12 level of less than 200 but has never taken B12 injections.  She is also supposed to take vitamin D.  She has a history of iron deficiency anemia as well and used to have very heavy.'s but  has been postmenopausal since her mid 40s approximately.  She takes [facility] powder for relief of her headaches, this is the only medication that seems to help enough.  Tylenol did not help, ibuprofen did not help either.  She takes BC powder almost daily, usually just 1 a day.  She drinks no daily caffeine, she has not had any alcohol in a couple of years, she is a non-smoker.  She does not currently work.  She lives with her husband, she has 2 grown daughters, 1 lives in [place] locally, the younger 1 lives in [place].  She tries to hydrate well with water, she does not exercise on a regular basis.  She has a regular eye checkup.  She has contact lenses and goes to the eye doctor once a year.   She does not have significant nausea or vomiting or photophobia with these headaches, they are dull at times and sometimes throbbing.  They are not debilitating but nagging.  She admits to not sleeping well, she often goes to bed with a headache and wakes up tired, she has trouble maintaining sleep for years.  She tried melatonin which did not really help, did not make matters worse either.  She tried trazodone in the past which did help some but caused her GI side effects.  She does sleep with a TV on at night, puts it on a 60-minute timer, present on low volume.  She denies any significant trouble falling asleep.   I reviewed your office note from 01/25/2022.  She had blood work through your office at the time including CBC, CMP and TSH, I reviewed the test results.  Results were benign.   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 Orson Slick    Her Family History Is Significant For: Family History  Problem Relation Age of Onset   Hypertension Mother    Headache Mother    Hypertension Father    AAA (abdominal aortic aneurysm) Father    Sleep apnea Father    Heart attack Maternal Aunt    Heart attack Maternal Aunt    Heart attack Maternal Uncle        deceased age 37   Heart attack Maternal Uncle    Heart attack Maternal Grandmother    Heart attack Maternal Grandfather    Diabetes Paternal Grandmother  adult onset   Stroke Paternal Grandmother        deceased   Stroke Paternal Grandfather        deceased   Breast cancer Neg Hx    Colon cancer Neg Hx    Colon polyps Neg Hx    Crohn's disease Neg Hx    Esophageal cancer Neg Hx    Rectal cancer Neg Hx    Stomach cancer Neg Hx    Ulcerative colitis Neg Hx     Her Social History Is Significant For: Social History   Socioeconomic History   Marital status: Married    Spouse name: Not on file   Number of children: Not on file   Years of education: Not on file   Highest education level: Not on file  Occupational History   Not on file  Tobacco Use   Smoking status: Never   Smokeless tobacco: Never  Vaping Use   Vaping Use: Never used  Substance and Sexual Activity   Alcohol use: No    Alcohol/week: 0.0 standard drinks of alcohol   Drug use: No   Sexual activity: Yes    Partners: Male    Birth control/protection: Post-menopausal  Other Topics Concern   Not on file  Social History Narrative   Not on file   Social Determinants of Health   Financial Resource Strain: Not on file  Food Insecurity: Not on file  Transportation Needs: Not on file  Physical Activity: Not on file  Stress: Not on file  Social Connections: Not on file    Her Allergies Are:  No Known Allergies:   Her Current Medications Are:  Outpatient Encounter Medications as of 06/11/2022  Medication Sig   amitriptyline (ELAVIL) 25 MG tablet Take 1 tablet (25  mg total) by mouth at bedtime.   Aspirin-Salicylamide-Caffeine (BC HEADACHE POWDER PO) Take by mouth as needed.   estradiol (VIVELLE-DOT) 0.05 MG/24HR patch Place 1 patch (0.05 mg total) onto the skin 2 (two) times a week.   norethindrone (AYGESTIN) 5 MG tablet Take 0.5 tablets (2.5 mg total) by mouth daily.   valACYclovir (VALTREX) 500 MG tablet TAKE 1 TABLET BY MOUTH TWICE A DAY X 3 DAYS AS NEEDED   VITAMIN D PO Take 1,000 Units/day by mouth. Twice weekly   ibuprofen (ADVIL,MOTRIN) 200 MG tablet Take 200 mg by mouth every 6 (six) hours as needed.   vitamin B-12 (CYANOCOBALAMIN) 500 MCG tablet Take 500 mcg by mouth daily. (Patient not taking: Reported on 06/11/2022)   No facility-administered encounter medications on file as of 06/11/2022.  :  Review of Systems:  Out of a complete 14 point review of systems, all are reviewed and negative with the exception of these symptoms as listed below:   Review of Systems  Neurological:        Pt here for headache f/u Pt states headaches are better,Pt states she has insomnia and feels headaches are more sleep related     Objective:  Neurological Exam  Physical Exam Physical Examination:   Vitals:   06/11/22 0939  BP: (!) 143/79  Pulse: (!) 104    General Examination: The patient is a very pleasant 52 y.o. female in no acute distress. She appears well-developed and well-nourished and well groomed.   HEENT: Normocephalic, atraumatic, pupils are equal, round and reactive to light, no photophobia.  Contact lenses in place.  Extraocular tracking is good without limitation to gaze excursion or nystagmus noted. Hearing is grossly intact. Face is symmetric  with normal facial animation. Speech is clear with no dysarthria noted. There is no hypophonia. There is no lip, neck/head, jaw or voice tremor. Neck is supple with full range of passive and active motion. There are no carotid bruits on auscultation.  Airway examination shows a small airway,  otherwise benign findings.  Tongue protrudes centrally and palate elevates symmetrically.     Chest: Clear to auscultation without wheezing, rhonchi or crackles noted.   Heart: S1+S2+0, regular and normal without murmurs, rubs or gallops noted.    Abdomen: Soft, non-tender and non-distended.   Extremities: There is no obvious edema in the distal lower extremities bilaterally.  Pedal pulses intact.   Skin: Warm and dry without trophic changes noted.    Musculoskeletal: exam reveals no obvious joint deformities.    Neurologically:  Mental status: The patient is awake, alert and oriented in all 4 spheres. Her immediate and remote memory, attention, language skills and fund of knowledge are appropriate. There is no evidence of aphasia, agnosia, apraxia or anomia. Speech is clear with normal prosody and enunciation. Thought process is linear. Mood is normal and affect is normal.  Cranial nerves II - XII are as described above under HEENT exam.  Motor exam: Normal bulk, strength and tone is noted. There is no obvious action or resting tremor.  No significant postural or intention tremor.  Reflexes 2+. Fine motor skills and coordination: Intact grossly in the upper and lower extremities. Cerebellar testing: No dysmetria or intention tremor. There is no truncal or gait ataxia.   Sensory exam: intact to light touch in the upper and lower extremities.  Gait, station and balance: She stands easily. No veering to one side is noted. No leaning to one side is noted. Posture is age-appropriate and stance is narrow based. Gait shows normal stride length and normal pace. No problems turning are noted.  Romberg negative.  Tandem walk normal.   Assessment and Plan:    In summary, Crystal Macdonald is a very pleasant 52 year old female with an underlying medical history of anemia, hyperlipidemia, history of heart murmur, PVCs, iron deficiency anemia, B12 deficiency,, vitamin D deficiency, and migraine  headaches, who presents for follow-up consultation of her recurrent headaches.  Brain MRI in January 2024 was benign, neurological exam continues to be nonfocal.  She has had improvement with amitriptyline low-dose, currently at 25 mg at bedtime but she has had some interim weight gain.  She would like to see if she can come off of the medication.  She is advised to lower the dose to half a pill each night, 12.5 mg strength for the next week or 2 and then stop completely.  We talked about sleep testing with a home sleep test.  She currently declines testing but would like to think about it.  She is advised to follow-up with her primary care regarding hyperlipidemia.  We talked about headache triggers and the importance of pursuing a healthy lifestyle.  She is encouraged to continue to hydrate well and monitor her weight especially after she comes off the amitriptyline.  At this juncture, she can follow-up in this clinic on an as-needed basis.  I answered all her questions today and she was in agreement.  I spent 30 minutes in total face-to-face time and in reviewing records during pre-charting, more than 50% of which was spent in counseling and coordination of care, reviewing test results, reviewing medications and treatment regimen and/or in discussing or reviewing the diagnosis of rec. HAs, the  prognosis and treatment options. Pertinent laboratory and imaging test results that were available during this visit with the patient were reviewed by me and considered in my medical decision making (see chart for details).

## 2022-06-11 NOTE — Patient Instructions (Signed)
It was nice to see you again.  I am glad to hear your headaches are better.  As discussed, you can taper off the amitriptyline by taking half a pill at bedtime for a week or 2 and then stop completely.  If you would like to pursue sleep testing with a home sleep test, please let us know. Please follow-up with your primary care as scheduled, I would recommend that you consider starting a statin as advised by your PCP. At this juncture, you can follow-up in this clinic as needed.

## 2022-11-23 ENCOUNTER — Telehealth: Payer: Self-pay | Admitting: Neurology

## 2022-11-23 NOTE — Telephone Encounter (Signed)
Pt having 3 days ago started having a headaches. Get some releif if take a BC, but headache comes. Would like a call from the nurse to discuss if need to go back on the amitriptyline (ELAVIL) 25 MG tablet, If so how to start taking it.

## 2022-11-26 NOTE — Telephone Encounter (Signed)
I am also happy to see her if needed, sounds like she should just try to restart amitriptyline as this was previously helpful which can be assisted by PCP (as Amy mentioned).

## 2022-11-26 NOTE — Telephone Encounter (Signed)
Called pt and LVM for call back.

## 2022-11-26 NOTE — Telephone Encounter (Signed)
I spoke with the patient and provided the information from both NPs Amy & Shanda Bumps.  The patient was very appreciative. She would like to give it a few more days and see how she does since she does feel so much better than she did Thursday. She will let us know if she changes her mind and wants to be seen.

## 2022-11-26 NOTE — Telephone Encounter (Signed)
Upon review of last office visit April 2024, pt weaned off Amitriptyline due to improvement in headaches.   I spoke with the patient.  She states right now she is good but the other day her headache was pretty bad.  She had soreness of the head as well which is unusual for her.  She states that her headaches started coming back in July nearly 4-5 days/week. She had been taking BC powders. She understands there is a risk for rebound headaches. She states Tylenol does not work for her.  A couple of times she has woken up with a headache but that is not typical for her.  I reminded her that Dr Frances Furbish had offered to order HST if patient decides she wants one. She is asking if there is anything else that she can take when she sees its going to be a "bad one" or if she needs to go back on Amitriptyline. She is willing to see NP if needed. She states these are the same type of headaches as before. I advised an appointment likely will be needed so clinical notes can be updated and support any potential medication changes. Pt verbalized appreciation for the call.

## 2022-11-26 NOTE — Telephone Encounter (Signed)
Pt called and LVM for Rn to call her back when available.

## 2022-12-28 ENCOUNTER — Other Ambulatory Visit (HOSPITAL_BASED_OUTPATIENT_CLINIC_OR_DEPARTMENT_OTHER): Payer: Self-pay | Admitting: Obstetrics & Gynecology

## 2023-01-16 ENCOUNTER — Other Ambulatory Visit: Payer: Self-pay | Admitting: Family Medicine

## 2023-01-16 DIAGNOSIS — Z1231 Encounter for screening mammogram for malignant neoplasm of breast: Secondary | ICD-10-CM

## 2023-01-22 ENCOUNTER — Ambulatory Visit: Payer: 59

## 2023-01-22 ENCOUNTER — Ambulatory Visit
Admission: RE | Admit: 2023-01-22 | Discharge: 2023-01-22 | Disposition: A | Discharge status: Home / Self Care | Payer: 59 | Source: Ambulatory Visit | Attending: Family Medicine | Admitting: Family Medicine

## 2023-01-22 DIAGNOSIS — Z1231 Encounter for screening mammogram for malignant neoplasm of breast: Secondary | ICD-10-CM

## 2023-01-23 ENCOUNTER — Ambulatory Visit (HOSPITAL_BASED_OUTPATIENT_CLINIC_OR_DEPARTMENT_OTHER): Payer: 59 | Admitting: Obstetrics & Gynecology

## 2023-01-29 NOTE — Progress Notes (Signed)
52 y.o. [id number] Married White or Caucasian female here for annual exam.  Doing well.  She is still having issues with sleep.  She has had insomnia for years.  She did see neurology about this.  Took amitriptyline and this helped.  But she weaned off this on her own.  This was helping.  She has called Dr. Frances Furbish and was advised she needs to be seen.    Patient's last menstrual period was 05/20/2012.           Smoker:  no  Health Maintenance: Pap:  01/26/2021 Negative History of abnormal Pap:  no MMG:  01/22/2023 Negative Colonoscopy:  03/27/2022, one adenomatous polyp follow up 7 years BMD:   01/29/2022 Osteopenia Screening Labs: done with Dr. Azucena Cecil   reports that she has never smoked. She has never used smokeless tobacco. She reports that she does not drink alcohol and does not use drugs.  Past Medical History:  Diagnosis Date   Anemia    IDA   Heart murmur    "AS A CHILD OUT GREW IT"   Hyperlipidemia    Insomnia    STD (sexually transmitted disease)    HX HSV II    Past Surgical History:  Procedure Laterality Date   CESAREAN SECTION  1996   DILATION AND EVACUATION  1998   UMBILICAL HERNIA REPAIR  2006   with mesh--Dr. Orson Slick    Current Outpatient Medications  Medication Sig Dispense Refill   Aspirin-Salicylamide-Caffeine (BC HEADACHE POWDER PO) Take by mouth as needed.     VITAMIN D PO Take 1,000 Units/day by mouth. Twice weekly     [START ON 01/31/2023] estradiol (VIVELLE-DOT) 0.05 MG/24HR patch Place 1 patch (0.05 mg total) onto the skin 2 (two) times a week. 24 patch 3   norethindrone (AYGESTIN) 5 MG tablet Take 0.5 tablets (2.5 mg total) by mouth daily. 90 tablet 3   valACYclovir (VALTREX) 500 MG tablet TAKE 1 TABLET BY MOUTH TWICE A DAY X 3 DAYS AS NEEDED 30 tablet 2   No current facility-administered medications for this visit.    Family History  Problem Relation Age of Onset   Hypertension Mother    Headache Mother    Hypertension Father    AAA (abdominal  aortic aneurysm) Father    Sleep apnea Father    Heart attack Maternal Aunt    Heart attack Maternal Aunt    Heart attack Maternal Uncle        deceased age 25   Heart attack Maternal Uncle    Heart attack Maternal Grandmother    Heart attack Maternal Grandfather    Diabetes Paternal Grandmother        adult onset   Stroke Paternal Grandmother        deceased   Stroke Paternal Grandfather        deceased   Breast cancer Neg Hx    Colon cancer Neg Hx    Colon polyps Neg Hx    Crohn's disease Neg Hx    Esophageal cancer Neg Hx    Rectal cancer Neg Hx    Stomach cancer Neg Hx    Ulcerative colitis Neg Hx     ROS: Constitutional: negative Genitourinary:negative  Exam:   BP 133/84 (BP Location: Right Arm, Patient Position: Sitting, Cuff Size: Normal)   Pulse 89   Ht 5' 4.25" (1.632 m)   Wt 128 lb 6.4 oz (58.2 kg)   LMP 05/20/2012   BMI 21.87 kg/m   Height: 5'  4.25" (163.2 cm)  General appearance: alert, cooperative and appears stated age Head: Normocephalic, without obvious abnormality, atraumatic Neck: no adenopathy, supple, symmetrical, trachea midline and thyroid normal to inspection and palpation Lungs: clear to auscultation bilaterally Breasts: normal appearance, no masses or tenderness Heart: regular rate and rhythm Abdomen: soft, non-tender; bowel sounds normal; no masses,  no organomegaly Extremities: extremities normal, atraumatic, no cyanosis or edema Skin: Skin color, texture, turgor normal. No rashes or lesions Lymph nodes: Cervical, supraclavicular, and axillary nodes normal. No abnormal inguinal nodes palpated Neurologic: Grossly normal   Pelvic: External genitalia:  no lesions              Urethra:  normal appearing urethra with no masses, tenderness or lesions              Bartholins and Skenes: normal                 Vagina: normal appearing vagina with normal color and no discharge, no lesions              Cervix: no lesions              Pap  taken: No. Bimanual Exam:  Uterus:  normal size, contour, position, consistency, mobility, non-tender              Adnexa: normal adnexa and no mass, fullness, tenderness               Rectovaginal: Confirms               Anus:  normal sphincter tone, no lesions  Chaperone, Ina Homes, CMA, was present for exam.  Assessment/Plan: 1. Well woman exam with routine gynecological exam (Primary) - Pap smear 01/2021.  Not indicated today. - Mammogram 01/22/2023 - Colonoscopy 03/27/2022, follow up 7 years - Bone mineral density 01/29/2022 - lab work done with PCP, Dr. Azucena Cecil - vaccines reviewed/updated  2. HSV-2 infection - RF for valtrex to pharmacy - valACYclovir (VALTREX) 500 MG tablet; TAKE 1 TABLET BY MOUTH TWICE A DAY X 3 DAYS AS NEEDED  Dispense: 30 tablet; Refill: 2  3. Hormone replacement therapy (HRT) - estradiol (VIVELLE-DOT) 0.05 MG/24HR patch; Place 1 patch (0.05 mg total) onto the skin 2 (two) times a week.  Dispense: 24 patch; Refill: 3 - norethindrone (AYGESTIN) 5 MG tablet; Take 0.5 tablets (2.5 mg total) by mouth daily.  Dispense: 90 tablet; Refill: 3  4. Chronic insomnia

## 2023-01-30 ENCOUNTER — Encounter (HOSPITAL_BASED_OUTPATIENT_CLINIC_OR_DEPARTMENT_OTHER): Payer: Self-pay | Admitting: Obstetrics & Gynecology

## 2023-01-30 ENCOUNTER — Ambulatory Visit (HOSPITAL_BASED_OUTPATIENT_CLINIC_OR_DEPARTMENT_OTHER): Payer: 59 | Admitting: Obstetrics & Gynecology

## 2023-01-30 VITALS — BP 133/84 | HR 89 | Ht 64.25 in | Wt 128.4 lb

## 2023-01-30 DIAGNOSIS — Z01419 Encounter for gynecological examination (general) (routine) without abnormal findings: Secondary | ICD-10-CM

## 2023-01-30 DIAGNOSIS — F5104 Psychophysiologic insomnia: Secondary | ICD-10-CM

## 2023-01-30 DIAGNOSIS — Z79890 Hormone replacement therapy (postmenopausal): Secondary | ICD-10-CM

## 2023-01-30 DIAGNOSIS — B009 Herpesviral infection, unspecified: Secondary | ICD-10-CM | POA: Diagnosis not present

## 2023-01-30 MED ORDER — VALACYCLOVIR HCL 500 MG PO TABS: ORAL_TABLET | ORAL | 2 refills | Status: DC

## 2023-01-30 MED ORDER — NORETHINDRONE ACETATE 5 MG PO TABS: 2.5000 mg | ORAL_TABLET | Freq: Every day | ORAL | 3 refills | Status: DC

## 2023-01-30 MED ORDER — ESTRADIOL 0.05 MG/24HR TD PTTW: 1.0000 | MEDICATED_PATCH | TRANSDERMAL | 3 refills | Status: DC

## 2023-02-06 ENCOUNTER — Other Ambulatory Visit (HOSPITAL_BASED_OUTPATIENT_CLINIC_OR_DEPARTMENT_OTHER): Payer: Self-pay | Admitting: Obstetrics & Gynecology

## 2023-02-06 DIAGNOSIS — B009 Herpesviral infection, unspecified: Secondary | ICD-10-CM

## 2023-02-07 ENCOUNTER — Ambulatory Visit (HOSPITAL_BASED_OUTPATIENT_CLINIC_OR_DEPARTMENT_OTHER): Payer: 59 | Admitting: Certified Nurse Midwife

## 2023-02-07 ENCOUNTER — Ambulatory Visit (HOSPITAL_BASED_OUTPATIENT_CLINIC_OR_DEPARTMENT_OTHER): Payer: 59 | Admitting: Obstetrics & Gynecology

## 2023-12-09 ENCOUNTER — Other Ambulatory Visit: Payer: Self-pay | Admitting: Obstetrics & Gynecology

## 2023-12-09 DIAGNOSIS — Z1231 Encounter for screening mammogram for malignant neoplasm of breast: Secondary | ICD-10-CM

## 2023-12-24 ENCOUNTER — Other Ambulatory Visit (HOSPITAL_BASED_OUTPATIENT_CLINIC_OR_DEPARTMENT_OTHER): Payer: Self-pay | Admitting: Obstetrics & Gynecology

## 2023-12-24 DIAGNOSIS — Z79890 Hormone replacement therapy (postmenopausal): Secondary | ICD-10-CM

## 2024-01-23 ENCOUNTER — Ambulatory Visit
Admission: RE | Admit: 2024-01-23 | Discharge: 2024-01-23 | Disposition: A | Source: Ambulatory Visit | Attending: Obstetrics & Gynecology | Admitting: Obstetrics & Gynecology

## 2024-01-23 DIAGNOSIS — Z1231 Encounter for screening mammogram for malignant neoplasm of breast: Secondary | ICD-10-CM

## 2024-01-26 ENCOUNTER — Other Ambulatory Visit (HOSPITAL_BASED_OUTPATIENT_CLINIC_OR_DEPARTMENT_OTHER): Payer: Self-pay | Admitting: Obstetrics & Gynecology

## 2024-01-26 DIAGNOSIS — Z79890 Hormone replacement therapy (postmenopausal): Secondary | ICD-10-CM

## 2024-02-04 ENCOUNTER — Other Ambulatory Visit (HOSPITAL_COMMUNITY)
Admission: RE | Admit: 2024-02-04 | Discharge: 2024-02-04 | Disposition: A | Source: Ambulatory Visit | Attending: Obstetrics & Gynecology | Admitting: Obstetrics & Gynecology

## 2024-02-04 ENCOUNTER — Ambulatory Visit (HOSPITAL_BASED_OUTPATIENT_CLINIC_OR_DEPARTMENT_OTHER): Payer: 59 | Admitting: Obstetrics & Gynecology

## 2024-02-04 ENCOUNTER — Encounter (HOSPITAL_BASED_OUTPATIENT_CLINIC_OR_DEPARTMENT_OTHER): Payer: Self-pay | Admitting: Obstetrics & Gynecology

## 2024-02-04 VITALS — BP 121/80 | HR 78 | Ht 64.0 in | Wt 132.0 lb

## 2024-02-04 DIAGNOSIS — B009 Herpesviral infection, unspecified: Secondary | ICD-10-CM | POA: Diagnosis not present

## 2024-02-04 DIAGNOSIS — Z01419 Encounter for gynecological examination (general) (routine) without abnormal findings: Secondary | ICD-10-CM | POA: Diagnosis not present

## 2024-02-04 DIAGNOSIS — Z79890 Hormone replacement therapy (postmenopausal): Secondary | ICD-10-CM | POA: Diagnosis not present

## 2024-02-04 DIAGNOSIS — Z1151 Encounter for screening for human papillomavirus (HPV): Secondary | ICD-10-CM | POA: Diagnosis not present

## 2024-02-04 DIAGNOSIS — Z1331 Encounter for screening for depression: Secondary | ICD-10-CM | POA: Diagnosis not present

## 2024-02-04 MED ORDER — VALACYCLOVIR HCL 500 MG PO TABS: ORAL_TABLET | ORAL | 1 refills | Status: AC

## 2024-02-04 MED ORDER — ESTRADIOL 0.05 MG/24HR TD PTTW: 1.0000 | MEDICATED_PATCH | TRANSDERMAL | 3 refills | Status: AC

## 2024-02-04 MED ORDER — NORETHINDRONE ACETATE 5 MG PO TABS: 2.5000 mg | ORAL_TABLET | Freq: Every day | ORAL | 3 refills | Status: AC

## 2024-02-04 NOTE — Progress Notes (Unsigned)
 ANNUAL EXAM Patient name: Crystal Macdonald MRN [id number]  Date of birth: [birth year] Chief Complaint:   Gynecologic Exam  History of Present Illness:   Crystal Macdonald is a 53 y.o. [phone] Caucasian female being seen today for a routine annual exam.  Patient reports no issues or concerns today. She does report urinating herself a couple of weeks ago during a coughing fit.  Is on HRT and feels good with dosage where she is.  Had issues with getting her vivelle  dot patches.    Denies vaginal bleeding.    Patient's last menstrual period was 05/20/2012.  Last pap 01/23/2021. Results were: NILM w/ HRHPV negative. H/O abnormal pap: yes Last mammogram: 01/23/2024. Results were: normal. Family h/o breast cancer: no Last colonoscopy: 03/27/2022. Results were: abnormal 1 polyp. Family h/o colorectal cancer: no.  Follow up 7 years.   Dexa:  -1.5 left femur 01/29/2022.       02/04/2024    8:49 AM 01/30/2023   10:30 AM 01/25/2022    8:54 AM 01/23/2021    8:50 AM  Depression screen PHQ 2/9  Decreased Interest 0 0 0 0  Down, Depressed, Hopeless 0 1 0 0  PHQ - 2 Score 0 1 0 0     Review of Systems:   Pertinent items are noted in HPI Denies any bowel changes.  Denies pelvic pain.   Pertinent History Reviewed:  Reviewed past medical,surgical, social and family history.  Reviewed problem list, medications and allergies. Physical Assessment:   Vitals:   02/04/24 0844  BP: 121/80  Pulse: 78  SpO2: 100%  Weight: 132 lb (59.9 kg)  Height: 5' 4 (1.626 m)  Body mass index is 22.66 kg/m.        Physical Examination:   General appearance - well appearing, and in no distress  Mental status - alert, oriented to person, place, and time  Psych:  She has a normal mood and affect  Skin - warm and dry, normal color, no suspicious lesions noted  Chest - effort normal, all lung fields clear to auscultation bilaterally  Heart - normal rate and regular rhythm  Neck:  midline trachea, no  thyromegaly or nodules  Breasts - breasts appear normal, no suspicious masses, no skin or nipple changes or  axillary nodes  Abdomen - soft, nontender, nondistended, no masses or organomegaly  Pelvic - VULVA: normal appearing vulva with no masses, tenderness or lesions   VAGINA: normal appearing vagina with normal color and discharge, no lesions   CERVIX: normal appearing cervix without discharge or lesions, no CMT  Thin prep pap is with HR HPV cotesting  UTERUS: uterus is felt to be normal size, shape, consistency and nontender   ADNEXA: No adnexal masses or tenderness noted.  Rectal - normal rectal, good sphincter tone, no masses felt.  Extremities:  No swelling or varicosities noted  Chaperone present for exam  No results found for this or any previous visit (from the past 24 hours).  Assessment & Plan:  1. Well woman exam with routine gynecological exam (Primary) - Pap smear with HR HPV obtained today - Mammogram 01/23/2024 - Colonoscopy 03/27/2022 - Bone mineral density 01/29/2022 - lab work done with PCP, Dr. Seabron - vaccines reviewed/updated  2. Hormone replacement therapy (HRT) - will continue current dosage as she is doing well - estradiol  (VIVELLE -DOT) 0.05 MG/24HR patch; Place 1 patch (0.05 mg total) onto the skin 2 (two) times a week.  Dispense: 24 patch; Refill: 3 - norethindrone  (  AYGESTIN ) 5 MG tablet; Take 0.5 tablets (2.5 mg total) by mouth daily.  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   Meds:  Meds ordered this encounter  Medications   estradiol  (VIVELLE -DOT) 0.05 MG/24HR patch    Sig: Place 1 patch (0.05 mg total) onto the skin 2 (two) times a week.    Dispense:  24 patch    Refill:  3    Adjust to monthly RF is insurance requires.   Thank you.   norethindrone  (AYGESTIN ) 5 MG tablet    Sig: Take 0.5 tablets (2.5 mg total) by mouth daily.    Dispense:  45 tablet    Refill:  3   valACYclovir  (VALTREX ) 500 MG tablet    Sig: TAKE 1 TABLET BY MOUTH TWICE A DAY FOR 3 DAYS AS NEEDED    Dispense:  30 tablet    Refill:  1    Follow-up: Return in about 1 year (around 02/03/2025).  Ronal GORMAN Pinal, MD 02/05/2024 8:39 AM

## 2024-02-07 LAB — CYTOLOGY - PAP
Comment: NEGATIVE
Diagnosis: NEGATIVE
High risk HPV: NEGATIVE

## 2024-02-11 ENCOUNTER — Ambulatory Visit (HOSPITAL_BASED_OUTPATIENT_CLINIC_OR_DEPARTMENT_OTHER): Payer: Self-pay | Admitting: Obstetrics & Gynecology

## 2024-02-23 ENCOUNTER — Other Ambulatory Visit (HOSPITAL_BASED_OUTPATIENT_CLINIC_OR_DEPARTMENT_OTHER): Payer: Self-pay | Admitting: Obstetrics & Gynecology

## 2024-02-23 DIAGNOSIS — Z79890 Hormone replacement therapy (postmenopausal): Secondary | ICD-10-CM

## 2024-03-15 ENCOUNTER — Encounter (HOSPITAL_BASED_OUTPATIENT_CLINIC_OR_DEPARTMENT_OTHER): Payer: Self-pay

## 2024-03-16 ENCOUNTER — Ambulatory Visit (HOSPITAL_BASED_OUTPATIENT_CLINIC_OR_DEPARTMENT_OTHER): Payer: Self-pay | Admitting: Obstetrics & Gynecology

## 2024-04-15 ENCOUNTER — Ambulatory Visit (HOSPITAL_BASED_OUTPATIENT_CLINIC_OR_DEPARTMENT_OTHER): Payer: Self-pay | Admitting: Obstetrics & Gynecology
